# Patient Record
Sex: Female | Born: 1970 | Race: White | Hispanic: No | Smoking: Former smoker
Health system: Southern US, Community
[De-identification: ages and names within clinical notes are randomized; demographics above are authoritative.]

## PROBLEM LIST (undated history)

## (undated) DIAGNOSIS — T7840XA Allergy, unspecified, initial encounter: Secondary | ICD-10-CM

## (undated) DIAGNOSIS — F419 Anxiety disorder, unspecified: Secondary | ICD-10-CM

## (undated) DIAGNOSIS — M1990 Unspecified osteoarthritis, unspecified site: Secondary | ICD-10-CM

## (undated) DIAGNOSIS — J45909 Unspecified asthma, uncomplicated: Secondary | ICD-10-CM

## (undated) DIAGNOSIS — F909 Attention-deficit hyperactivity disorder, unspecified type: Secondary | ICD-10-CM

## (undated) DIAGNOSIS — K219 Gastro-esophageal reflux disease without esophagitis: Secondary | ICD-10-CM

## (undated) HISTORY — PX: SCALP REDUCTION: SHX2377

## (undated) HISTORY — DX: Unspecified osteoarthritis, unspecified site: M19.90

## (undated) HISTORY — PX: COSMETIC SURGERY: SHX468

## (undated) HISTORY — DX: Anxiety disorder, unspecified: F41.9

## (undated) HISTORY — PX: TUBAL LIGATION: SHX77

## (undated) HISTORY — DX: Attention-deficit hyperactivity disorder, unspecified type: F90.9

## (undated) HISTORY — DX: Allergy, unspecified, initial encounter: T78.40XA

## (undated) HISTORY — DX: Gastro-esophageal reflux disease without esophagitis: K21.9

---

## 1999-04-30 ENCOUNTER — Other Ambulatory Visit: Admission: RE | Admit: 1999-04-30 | Discharge: 1999-04-30 | Payer: Self-pay | Admitting: Obstetrics & Gynecology

## 1999-11-27 ENCOUNTER — Inpatient Hospital Stay (HOSPITAL_COMMUNITY): Admission: AD | Admit: 1999-11-27 | Discharge: 1999-11-29 | Payer: Self-pay | Admitting: Obstetrics & Gynecology

## 1999-12-28 ENCOUNTER — Other Ambulatory Visit: Admission: RE | Admit: 1999-12-28 | Discharge: 1999-12-28 | Payer: Self-pay | Admitting: Obstetrics & Gynecology

## 2003-03-16 ENCOUNTER — Emergency Department (HOSPITAL_COMMUNITY): Admission: EM | Admit: 2003-03-16 | Discharge: 2003-03-16 | Payer: Self-pay | Admitting: Emergency Medicine

## 2004-12-09 ENCOUNTER — Other Ambulatory Visit: Admission: RE | Admit: 2004-12-09 | Discharge: 2004-12-09 | Payer: Self-pay | Admitting: Obstetrics and Gynecology

## 2005-10-22 ENCOUNTER — Other Ambulatory Visit: Admission: RE | Admit: 2005-10-22 | Discharge: 2005-10-22 | Payer: Self-pay | Admitting: Obstetrics and Gynecology

## 2006-04-25 ENCOUNTER — Inpatient Hospital Stay (HOSPITAL_COMMUNITY): Admission: AD | Admit: 2006-04-25 | Discharge: 2006-04-27 | Payer: Self-pay | Admitting: Obstetrics and Gynecology

## 2007-06-08 ENCOUNTER — Encounter: Admission: RE | Admit: 2007-06-08 | Discharge: 2007-06-08 | Payer: Self-pay | Admitting: Emergency Medicine

## 2007-06-17 ENCOUNTER — Encounter: Admission: RE | Admit: 2007-06-17 | Discharge: 2007-06-17 | Payer: Self-pay | Admitting: Emergency Medicine

## 2007-06-19 ENCOUNTER — Encounter: Admission: RE | Admit: 2007-06-19 | Discharge: 2007-06-19 | Payer: Self-pay | Admitting: Emergency Medicine

## 2007-10-31 ENCOUNTER — Emergency Department (HOSPITAL_COMMUNITY): Admission: EM | Admit: 2007-10-31 | Discharge: 2007-10-31 | Payer: Self-pay | Admitting: Emergency Medicine

## 2008-06-25 ENCOUNTER — Inpatient Hospital Stay (HOSPITAL_COMMUNITY): Admission: AD | Admit: 2008-06-25 | Discharge: 2008-06-25 | Payer: Self-pay | Admitting: Obstetrics and Gynecology

## 2008-12-16 DIAGNOSIS — R55 Syncope and collapse: Secondary | ICD-10-CM

## 2008-12-16 DIAGNOSIS — D649 Anemia, unspecified: Secondary | ICD-10-CM | POA: Insufficient documentation

## 2008-12-16 DIAGNOSIS — F329 Major depressive disorder, single episode, unspecified: Secondary | ICD-10-CM

## 2008-12-16 DIAGNOSIS — F429 Obsessive-compulsive disorder, unspecified: Secondary | ICD-10-CM | POA: Insufficient documentation

## 2008-12-16 DIAGNOSIS — G510 Bell's palsy: Secondary | ICD-10-CM

## 2008-12-16 DIAGNOSIS — R87619 Unspecified abnormal cytological findings in specimens from cervix uteri: Secondary | ICD-10-CM

## 2009-01-14 ENCOUNTER — Inpatient Hospital Stay (HOSPITAL_COMMUNITY): Admission: AD | Admit: 2009-01-14 | Discharge: 2009-01-14 | Payer: Self-pay | Admitting: Obstetrics and Gynecology

## 2009-01-26 ENCOUNTER — Inpatient Hospital Stay (HOSPITAL_COMMUNITY)
Admission: AD | Admit: 2009-01-26 | Discharge: 2009-01-28 | Payer: Self-pay | Source: Ambulatory Visit | Admitting: Obstetrics and Gynecology

## 2009-01-26 ENCOUNTER — Encounter (INDEPENDENT_AMBULATORY_CARE_PROVIDER_SITE_OTHER): Payer: Self-pay | Admitting: Obstetrics and Gynecology

## 2010-03-15 ENCOUNTER — Encounter: Payer: Self-pay | Admitting: Emergency Medicine

## 2010-05-26 LAB — CBC
HCT: 30.5 % — ABNORMAL LOW (ref 36.0–46.0)
HCT: 34.8 % — ABNORMAL LOW (ref 36.0–46.0)
Hemoglobin: 11.4 g/dL — ABNORMAL LOW (ref 12.0–15.0)
MCHC: 32.8 g/dL (ref 30.0–36.0)
MCHC: 32.8 g/dL (ref 30.0–36.0)
MCV: 83.6 fL (ref 78.0–100.0)
RBC: 3.64 MIL/uL — ABNORMAL LOW (ref 3.87–5.11)
RBC: 4.23 MIL/uL (ref 3.87–5.11)
RDW: 17.2 % — ABNORMAL HIGH (ref 11.5–15.5)

## 2010-06-02 LAB — CBC
HCT: 41.6 % (ref 36.0–46.0)
MCV: 84.9 fL (ref 78.0–100.0)
Platelets: 297 10*3/uL (ref 150–400)
RDW: 16.6 % — ABNORMAL HIGH (ref 11.5–15.5)
WBC: 11.2 10*3/uL — ABNORMAL HIGH (ref 4.0–10.5)

## 2010-06-02 LAB — URINALYSIS, ROUTINE W REFLEX MICROSCOPIC
Glucose, UA: >1000 mg/dL — AB
Ketones, ur: NEGATIVE mg/dL
Leukocytes, UA: NEGATIVE
Nitrite: NEGATIVE
Specific Gravity, Urine: <1.005 — ABNORMAL LOW (ref 1.005–1.030)
pH: 6.5 (ref 5.0–8.0)

## 2010-06-02 LAB — COMPREHENSIVE METABOLIC PANEL
AST: 19 U/L (ref 0–37)
Albumin: 4.1 g/dL (ref 3.5–5.2)
BUN: 7 mg/dL (ref 6–23)
Calcium: 10.2 mg/dL (ref 8.4–10.5)
Chloride: 102 mEq/L (ref 96–112)
Creatinine, Ser: 0.57 mg/dL (ref 0.4–1.2)
GFR calc Af Amer: >60 mL/min (ref >60)
Total Protein: 7.3 g/dL (ref 6.0–8.3)

## 2010-06-02 LAB — LIPASE, BLOOD: Lipase: 13 U/L (ref 11–59)

## 2010-06-02 LAB — URINE MICROSCOPIC-ADD ON
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
## 2014-08-06 ENCOUNTER — Telehealth: Payer: Self-pay | Admitting: Family Medicine

## 2014-08-06 NOTE — Telephone Encounter (Signed)
Left message asking pt to call office Please schedule new patient appointment

## 2014-08-07 NOTE — Telephone Encounter (Signed)
Pt retuned your call. Does it need to be a new pt appt or can any 30 min appt work?

## 2014-08-08 NOTE — Telephone Encounter (Signed)
Pt wanted to make a new patient appointment with you.  Your next new patient is 02/25/15.  Pt wanted to know if she could be worked in sooner.  Is ok to schedule? thanks

## 2014-08-08 NOTE — Telephone Encounter (Signed)
30 min appointment when possible, not on a Monday or Friday.  Thanks.

## 2014-08-13 NOTE — Telephone Encounter (Signed)
Left message asking pt to call office Please see note below and schedule appointment

## 2014-08-14 NOTE — Telephone Encounter (Signed)
Appointment 7/28 °Pt aware °

## 2014-09-19 ENCOUNTER — Ambulatory Visit: Payer: Self-pay | Admitting: Family Medicine

## 2018-07-25 ENCOUNTER — Telehealth: Payer: Self-pay

## 2018-07-25 NOTE — Telephone Encounter (Signed)
Error

## 2021-05-27 ENCOUNTER — Other Ambulatory Visit: Payer: Self-pay

## 2021-05-27 ENCOUNTER — Emergency Department (HOSPITAL_BASED_OUTPATIENT_CLINIC_OR_DEPARTMENT_OTHER)
Admission: EM | Admit: 2021-05-27 | Discharge: 2021-05-28 | Disposition: A | Discharge status: Home / Self Care | Payer: Medicaid Other | Attending: Emergency Medicine | Admitting: Emergency Medicine

## 2021-05-27 ENCOUNTER — Encounter (HOSPITAL_BASED_OUTPATIENT_CLINIC_OR_DEPARTMENT_OTHER): Payer: Self-pay | Admitting: Emergency Medicine

## 2021-05-27 ENCOUNTER — Emergency Department (HOSPITAL_BASED_OUTPATIENT_CLINIC_OR_DEPARTMENT_OTHER): Payer: Medicaid Other | Admitting: Radiology

## 2021-05-27 ENCOUNTER — Emergency Department (HOSPITAL_BASED_OUTPATIENT_CLINIC_OR_DEPARTMENT_OTHER): Payer: Medicaid Other

## 2021-05-27 DIAGNOSIS — R410 Disorientation, unspecified: Secondary | ICD-10-CM | POA: Diagnosis not present

## 2021-05-27 DIAGNOSIS — S8011XA Contusion of right lower leg, initial encounter: Secondary | ICD-10-CM | POA: Diagnosis not present

## 2021-05-27 DIAGNOSIS — S80212A Abrasion, left knee, initial encounter: Secondary | ICD-10-CM | POA: Diagnosis not present

## 2021-05-27 DIAGNOSIS — S5012XA Contusion of left forearm, initial encounter: Secondary | ICD-10-CM | POA: Insufficient documentation

## 2021-05-27 DIAGNOSIS — S0993XA Unspecified injury of face, initial encounter: Secondary | ICD-10-CM | POA: Diagnosis present

## 2021-05-27 DIAGNOSIS — S01451A Open bite of right cheek and temporomandibular area, initial encounter: Secondary | ICD-10-CM | POA: Insufficient documentation

## 2021-05-27 DIAGNOSIS — S5011XA Contusion of right forearm, initial encounter: Secondary | ICD-10-CM | POA: Diagnosis not present

## 2021-05-27 DIAGNOSIS — Z23 Encounter for immunization: Secondary | ICD-10-CM | POA: Diagnosis not present

## 2021-05-27 DIAGNOSIS — S1083XA Contusion of other specified part of neck, initial encounter: Secondary | ICD-10-CM | POA: Insufficient documentation

## 2021-05-27 DIAGNOSIS — S40022A Contusion of left upper arm, initial encounter: Secondary | ICD-10-CM | POA: Diagnosis not present

## 2021-05-27 DIAGNOSIS — M542 Cervicalgia: Secondary | ICD-10-CM | POA: Diagnosis not present

## 2021-05-27 DIAGNOSIS — W503XXA Accidental bite by another person, initial encounter: Secondary | ICD-10-CM

## 2021-05-27 DIAGNOSIS — S40021A Contusion of right upper arm, initial encounter: Secondary | ICD-10-CM | POA: Diagnosis not present

## 2021-05-27 HISTORY — DX: Unspecified asthma, uncomplicated: J45.909

## 2021-05-27 LAB — CBC WITH DIFFERENTIAL/PLATELET
Abs Immature Granulocytes: 0.03 10*3/uL (ref 0.00–0.07)
Basophils Absolute: 0.1 10*3/uL (ref 0.0–0.1)
Basophils Relative: 0 %
Eosinophils Absolute: 0.1 10*3/uL (ref 0.0–0.5)
Eosinophils Relative: 1 %
HCT: 42.7 % (ref 36.0–46.0)
Hemoglobin: 14 g/dL (ref 12.0–15.0)
Immature Granulocytes: 0 %
Lymphocytes Relative: 26 %
Lymphs Abs: 3 10*3/uL (ref 0.7–4.0)
MCH: 29.2 pg (ref 26.0–34.0)
MCHC: 32.8 g/dL (ref 30.0–36.0)
MCV: 89 fL (ref 80.0–100.0)
Monocytes Absolute: 0.9 10*3/uL (ref 0.1–1.0)
Monocytes Relative: 8 %
Neutro Abs: 7.7 10*3/uL (ref 1.7–7.7)
Neutrophils Relative %: 65 %
Platelets: 340 10*3/uL (ref 150–400)
RBC: 4.8 MIL/uL (ref 3.87–5.11)
RDW: 12.7 % (ref 11.5–15.5)
WBC: 11.7 10*3/uL — ABNORMAL HIGH (ref 4.0–10.5)
nRBC: 0 % (ref 0.0–0.2)

## 2021-05-27 LAB — BASIC METABOLIC PANEL
Anion gap: 11 (ref 5–15)
BUN: 17 mg/dL (ref 6–20)
CO2: 24 mmol/L (ref 22–32)
Calcium: 10.3 mg/dL (ref 8.9–10.3)
Chloride: 106 mmol/L (ref 98–111)
Creatinine, Ser: 0.84 mg/dL (ref 0.44–1.00)
GFR, Estimated: >60 mL/min (ref >60)
Glucose, Bld: 122 mg/dL — ABNORMAL HIGH (ref 70–99)
Potassium: 3.4 mmol/L — ABNORMAL LOW (ref 3.5–5.1)
Sodium: 141 mmol/L (ref 135–145)

## 2021-05-27 LAB — PREGNANCY, URINE: Preg Test, Ur: NEGATIVE

## 2021-05-27 MED ORDER — METRONIDAZOLE 500 MG PO TABS: 500.0000 mg | ORAL_TABLET | Freq: Two times a day (BID) | ORAL | 0 refills | Status: DC

## 2021-05-27 MED ORDER — DOXYCYCLINE HYCLATE 100 MG PO CAPS: 100.0000 mg | ORAL_CAPSULE | Freq: Two times a day (BID) | ORAL | 0 refills | Status: AC

## 2021-05-27 MED ORDER — ACETAMINOPHEN 325 MG PO TABS
650.0000 mg | ORAL_TABLET | Freq: Once | ORAL | Status: AC
Start: 2021-05-27 — End: 2021-05-27
  Administered 2021-05-27: 650 mg via ORAL
  Filled 2021-05-27: qty 2

## 2021-05-27 MED ORDER — IOHEXOL 350 MG/ML SOLN
100.0000 mL | Freq: Once | INTRAVENOUS | Status: AC | PRN
  Administered 2021-05-27: 75 mL via INTRAVENOUS

## 2021-05-27 MED ORDER — TETANUS-DIPHTH-ACELL PERTUSSIS 5-2.5-18.5 LF-MCG/0.5 IM SUSY
0.5000 mL | PREFILLED_SYRINGE | Freq: Once | INTRAMUSCULAR | Status: AC
  Administered 2021-05-28: 0.5 mL via INTRAMUSCULAR
  Filled 2021-05-27: qty 0.5
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
## 2021-05-28 ENCOUNTER — Telehealth: Payer: Self-pay | Admitting: Family Medicine
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
## 2021-05-28 NOTE — Telephone Encounter (Signed)
Opened in error.  I am not the PCP and have not seen this patient.  ?

## 2021-06-30 ENCOUNTER — Encounter: Payer: Self-pay | Admitting: Nurse Practitioner

## 2021-06-30 ENCOUNTER — Ambulatory Visit: Payer: Medicaid Other | Admitting: Nurse Practitioner

## 2021-06-30 VITALS — BP 128/66 | HR 66 | Temp 97.5°F | Resp 14 | Ht 65.0 in | Wt 205.2 lb

## 2021-06-30 DIAGNOSIS — Z7689 Persons encountering health services in other specified circumstances: Secondary | ICD-10-CM

## 2021-06-30 DIAGNOSIS — F422 Mixed obsessional thoughts and acts: Secondary | ICD-10-CM | POA: Diagnosis not present

## 2021-06-30 DIAGNOSIS — D72829 Elevated white blood cell count, unspecified: Secondary | ICD-10-CM | POA: Diagnosis not present

## 2021-06-30 DIAGNOSIS — E6609 Other obesity due to excess calories: Secondary | ICD-10-CM

## 2021-06-30 DIAGNOSIS — Z6834 Body mass index (BMI) 34.0-34.9, adult: Secondary | ICD-10-CM | POA: Diagnosis not present

## 2021-06-30 DIAGNOSIS — M6289 Other specified disorders of muscle: Secondary | ICD-10-CM | POA: Diagnosis not present

## 2021-06-30 DIAGNOSIS — E876 Hypokalemia: Secondary | ICD-10-CM

## 2021-06-30 DIAGNOSIS — E669 Obesity, unspecified: Secondary | ICD-10-CM | POA: Insufficient documentation

## 2021-06-30 DIAGNOSIS — G510 Bell's palsy: Secondary | ICD-10-CM | POA: Diagnosis not present

## 2021-06-30 DIAGNOSIS — Z0000 Encounter for general adult medical examination without abnormal findings: Secondary | ICD-10-CM | POA: Insufficient documentation

## 2021-06-30 DIAGNOSIS — F411 Generalized anxiety disorder: Secondary | ICD-10-CM | POA: Diagnosis not present

## 2021-06-30 DIAGNOSIS — Z6836 Body mass index (BMI) 36.0-36.9, adult: Secondary | ICD-10-CM | POA: Insufficient documentation

## 2021-06-30 LAB — CBC
HCT: 40.4 % (ref 36.0–46.0)
Hemoglobin: 13.5 g/dL (ref 12.0–15.0)
MCHC: 33.3 g/dL (ref 30.0–36.0)
MCV: 89.3 fl (ref 78.0–100.0)
Platelets: 273 10*3/uL (ref 150.0–400.0)
RBC: 4.53 Mil/uL (ref 3.87–5.11)
RDW: 12.9 % (ref 11.5–15.5)
WBC: 7.5 10*3/uL (ref 4.0–10.5)

## 2021-06-30 LAB — COMPREHENSIVE METABOLIC PANEL
ALT: 25 U/L (ref 0–35)
AST: 14 U/L (ref 0–37)
Albumin: 4.3 g/dL (ref 3.5–5.2)
Alkaline Phosphatase: 65 U/L (ref 39–117)
BUN: 14 mg/dL (ref 6–23)
CO2: 28 mEq/L (ref 19–32)
Calcium: 9.6 mg/dL (ref 8.4–10.5)
Chloride: 104 mEq/L (ref 96–112)
Creatinine, Ser: 0.88 mg/dL (ref 0.40–1.20)
GFR: 77.69 mL/min (ref >60.00)
Glucose, Bld: 88 mg/dL (ref 70–99)
Potassium: 4.1 mEq/L (ref 3.5–5.1)
Sodium: 139 mEq/L (ref 135–145)
Total Bilirubin: 0.4 mg/dL (ref 0.2–1.2)
Total Protein: 7.2 g/dL (ref 6.0–8.3)

## 2021-06-30 LAB — TSH: TSH: 0.86 u[IU]/mL (ref 0.35–5.50)

## 2021-06-30 LAB — LIPID PANEL
Cholesterol: 230 mg/dL — ABNORMAL HIGH (ref 0–200)
HDL: 47.8 mg/dL (ref >39.00)
LDL Cholesterol: 164 mg/dL — ABNORMAL HIGH (ref 0–99)
NonHDL: 181.73
Total CHOL/HDL Ratio: 5
Triglycerides: 87 mg/dL (ref 0.0–149.0)
VLDL: 17.4 mg/dL (ref 0.0–40.0)

## 2021-06-30 LAB — HEMOGLOBIN A1C: Hgb A1c MFr Bld: 6.4 % (ref 4.6–6.5)

## 2021-06-30 MED ORDER — CITALOPRAM HYDROBROMIDE 10 MG PO TABS: 10.0000 mg | ORAL_TABLET | Freq: Every day | ORAL | 3 refills | Status: DC

## 2021-06-30 MED ORDER — HYDROXYZINE PAMOATE 25 MG PO CAPS: 25.0000 mg | ORAL_CAPSULE | Freq: Two times a day (BID) | ORAL | 0 refills | Status: DC | PRN

## 2021-06-30 MED ORDER — CYCLOBENZAPRINE HCL 5 MG PO TABS: 5.0000 mg | ORAL_TABLET | Freq: Two times a day (BID) | ORAL | 0 refills | Status: DC | PRN

## 2021-06-30 NOTE — Assessment & Plan Note (Signed)
Most recent emergency room visit note, labs, imaging reviewed ?

## 2021-06-30 NOTE — Assessment & Plan Note (Signed)
Incidental finding from previous labs in the emergency department.  Pending labs today. ?

## 2021-06-30 NOTE — Assessment & Plan Note (Signed)
Pending labs today patient has lost approximately 55 pounds on her own with dietary modifications continue good work ?

## 2021-06-30 NOTE — Assessment & Plan Note (Signed)
History of the same.  PHQ-9 and GAD-7 administered in office.  Patient denies HI/SI/AVH.  Patient on citalopram 10 mg nightly and hydroxyzine 25 mg twice daily as needed anxiety.  Did review common side effects of both medications.  We will have patient follow-up emergency behavioral health information given in case of behavioral health emergency ?

## 2021-06-30 NOTE — Assessment & Plan Note (Signed)
We will try Flexeril 5 mg 3 times daily as needed muscle tightness sedation precautions reviewed with patient ?
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
## 2021-06-30 NOTE — Assessment & Plan Note (Signed)
Incidental finding on emergency room labs pending CBC today ?
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
## 2021-06-30 NOTE — Assessment & Plan Note (Signed)
Cervical diagnosis affecting left side of face. ?

## 2021-06-30 NOTE — Patient Instructions (Signed)
Nice to see you today ?I will be in touch with the lab results ?Follow up with me in 6 weeks, sooner if you need me ? ?The flexeril may make you sleepy (muscle relaxer) ?

## 2021-07-03 ENCOUNTER — Other Ambulatory Visit: Payer: Self-pay | Admitting: Nurse Practitioner

## 2021-07-03 ENCOUNTER — Encounter: Payer: Self-pay | Admitting: Nurse Practitioner

## 2021-07-03 DIAGNOSIS — F411 Generalized anxiety disorder: Secondary | ICD-10-CM

## 2021-07-03 MED ORDER — ALPRAZOLAM 0.25 MG PO TABS: 0.2500 mg | ORAL_TABLET | Freq: Two times a day (BID) | ORAL | 0 refills | Status: DC | PRN

## 2021-07-03 MED ORDER — OMEPRAZOLE 20 MG PO CPDR: 20.0000 mg | DELAYED_RELEASE_CAPSULE | Freq: Every day | ORAL | 0 refills | Status: DC

## 2021-07-06 ENCOUNTER — Other Ambulatory Visit: Payer: Self-pay | Admitting: Nurse Practitioner

## 2021-07-06 DIAGNOSIS — F411 Generalized anxiety disorder: Secondary | ICD-10-CM

## 2021-07-06 MED ORDER — LORAZEPAM 0.5 MG PO TABS
0.5000 mg | ORAL_TABLET | Freq: Two times a day (BID) | ORAL | 0 refills | Status: DC | PRN
Start: 2021-07-06 — End: 2021-07-23

## 2021-07-16 ENCOUNTER — Other Ambulatory Visit: Payer: Self-pay | Admitting: Nurse Practitioner

## 2021-07-16 DIAGNOSIS — F411 Generalized anxiety disorder: Secondary | ICD-10-CM

## 2021-07-17 NOTE — Telephone Encounter (Signed)
F/u made for 08/14/21. Patient states she does need a refill. She has 4 tablets left

## 2021-07-22 ENCOUNTER — Other Ambulatory Visit: Payer: Self-pay | Admitting: Nurse Practitioner

## 2021-07-22 ENCOUNTER — Encounter: Payer: Self-pay | Admitting: Nurse Practitioner

## 2021-07-22 DIAGNOSIS — F411 Generalized anxiety disorder: Secondary | ICD-10-CM

## 2021-07-23 MED ORDER — LORAZEPAM 0.5 MG PO TABS: 0.5000 mg | ORAL_TABLET | Freq: Two times a day (BID) | ORAL | 0 refills | Status: DC | PRN

## 2021-07-30 ENCOUNTER — Other Ambulatory Visit: Payer: Self-pay | Admitting: Nurse Practitioner

## 2021-07-30 DIAGNOSIS — M6289 Other specified disorders of muscle: Secondary | ICD-10-CM

## 2021-08-14 ENCOUNTER — Ambulatory Visit: Payer: Medicaid Other | Admitting: Nurse Practitioner

## 2021-08-14 ENCOUNTER — Encounter: Payer: Self-pay | Admitting: Nurse Practitioner

## 2021-08-14 VITALS — BP 120/74 | HR 64 | Temp 96.2°F | Resp 16 | Ht 65.0 in | Wt 210.1 lb

## 2021-08-14 DIAGNOSIS — R42 Dizziness and giddiness: Secondary | ICD-10-CM

## 2021-08-14 DIAGNOSIS — R202 Paresthesia of skin: Secondary | ICD-10-CM | POA: Diagnosis not present

## 2021-08-14 DIAGNOSIS — F411 Generalized anxiety disorder: Secondary | ICD-10-CM | POA: Diagnosis not present

## 2021-08-23 ENCOUNTER — Other Ambulatory Visit: Payer: Self-pay | Admitting: Nurse Practitioner

## 2021-08-23 DIAGNOSIS — F411 Generalized anxiety disorder: Secondary | ICD-10-CM

## 2021-08-28 ENCOUNTER — Other Ambulatory Visit: Payer: Self-pay | Admitting: Nurse Practitioner

## 2021-08-28 ENCOUNTER — Other Ambulatory Visit: Payer: Self-pay | Admitting: Family

## 2021-08-28 ENCOUNTER — Encounter: Payer: Self-pay | Admitting: Nurse Practitioner

## 2021-08-28 DIAGNOSIS — M6289 Other specified disorders of muscle: Secondary | ICD-10-CM

## 2021-08-28 DIAGNOSIS — F411 Generalized anxiety disorder: Secondary | ICD-10-CM

## 2021-08-28 MED ORDER — LORAZEPAM 0.5 MG PO TABS: 0.5000 mg | ORAL_TABLET | Freq: Two times a day (BID) | ORAL | 0 refills | Status: DC | PRN

## 2021-08-28 MED ORDER — CYCLOBENZAPRINE HCL 5 MG PO TABS: ORAL_TABLET | ORAL | 0 refills | Status: DC

## 2021-09-07 ENCOUNTER — Other Ambulatory Visit: Payer: Self-pay | Admitting: Nurse Practitioner

## 2021-09-07 DIAGNOSIS — F411 Generalized anxiety disorder: Secondary | ICD-10-CM

## 2021-09-23 ENCOUNTER — Other Ambulatory Visit: Payer: Self-pay | Admitting: Nurse Practitioner

## 2021-09-23 DIAGNOSIS — F411 Generalized anxiety disorder: Secondary | ICD-10-CM

## 2021-09-23 DIAGNOSIS — M6289 Other specified disorders of muscle: Secondary | ICD-10-CM

## 2021-10-12 ENCOUNTER — Other Ambulatory Visit: Payer: Self-pay | Admitting: Nurse Practitioner

## 2021-10-12 ENCOUNTER — Ambulatory Visit: Payer: Medicaid Other | Admitting: Family Medicine

## 2021-10-12 ENCOUNTER — Encounter: Payer: Self-pay | Admitting: Family Medicine

## 2021-10-12 VITALS — BP 110/70 | HR 84 | Temp 98.3°F | Resp 16 | Ht 65.0 in | Wt 205.5 lb

## 2021-10-12 DIAGNOSIS — L237 Allergic contact dermatitis due to plants, except food: Secondary | ICD-10-CM

## 2021-10-12 MED ORDER — TRIAMCINOLONE ACETONIDE 0.1 % EX CREA: 1.0000 | TOPICAL_CREAM | Freq: Two times a day (BID) | CUTANEOUS | 0 refills | Status: AC
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
## 2021-10-12 NOTE — Progress Notes (Signed)
   Subjective:     [name] is a 51 y.o. female presenting for Poison Ivy (X 4-5 day side, stomach, and arm)     HPI  #Poison ivy - was working in the yard and moving brush - last week - treatment: hydrocortisone, calamine, ice, alcohol, benadryl - trunk - has spread to the breast and lower abdomen and arm - itching - hydroxyzine and benadryl -    Review of Systems   Social History   Tobacco Use  Smoking Status Former   Packs/day: 0.25   Years: 30.00   Total pack years: 7.50   Types: Cigarettes   Quit date: 2016   Years since quitting: 7.6  Smokeless Tobacco Never        Objective:    BP Readings from Last 3 Encounters:  10/12/21 110/70  08/14/21 120/74  06/30/21 128/66   Wt Readings from Last 3 Encounters:  10/12/21 205 lb 8 oz (93.2 kg)  08/14/21 210 lb 2 oz (95.3 kg)  06/30/21 205 lb 4 oz (93.1 kg)    BP 110/70   Pulse 84   Temp 98.3 F (36.8 C)   Resp 16   Ht 5\' 5"  (1.651 m)   Wt 205 lb 8 oz (93.2 kg)   LMP 06/06/2021 (Exact Date)   SpO2 96%   BMI 34.20 kg/m    Physical Exam Constitutional:      General: She is not in acute distress.    Appearance: She is well-developed. She is not diaphoretic.  HENT:     Right Ear: External ear normal.     Left Ear: External ear normal.     Nose: Nose normal.  Eyes:     Conjunctiva/sclera: Conjunctivae normal.  Cardiovascular:     Rate and Rhythm: Normal rate.  Pulmonary:     Effort: Pulmonary effort is normal.  Musculoskeletal:     Cervical back: Neck supple.  Skin:    General: Skin is warm and dry.     Capillary Refill: Capillary refill takes less than 2 seconds.     Comments: Erythematous plaque along the abdomen and right inner arm. Some liner collections  Neurological:     Mental Status: She is alert. Mental status is at baseline.  Psychiatric:        Mood and Affect: Mood normal.        Behavior: Behavior normal.           Assessment & Plan:   Problem List Items  Addressed This Visit   None Visit Diagnoses     Poison ivy dermatitis    -  Primary   Relevant Medications   triamcinolone cream (KENALOG) 0.1 %      Discussed with small portion of body and no face involvement at this time would not recommend oral steroids.  Follow-up if worsening.  Continue hydroxyzine, Benadryl, start 24-hour allergy pill.  Prescription for triamcinolone provided to use twice daily.  Return if symptoms worsen or fail to improve.  06/08/2021, MD

## 2021-10-14 ENCOUNTER — Encounter: Payer: Self-pay | Admitting: Family Medicine

## 2021-10-14 DIAGNOSIS — L237 Allergic contact dermatitis due to plants, except food: Secondary | ICD-10-CM

## 2021-10-14 MED ORDER — PREDNISONE 20 MG PO TABS: ORAL_TABLET | ORAL | 0 refills | Status: AC

## 2021-11-19 ENCOUNTER — Other Ambulatory Visit: Payer: Self-pay | Admitting: Nurse Practitioner

## 2021-11-19 DIAGNOSIS — M6289 Other specified disorders of muscle: Secondary | ICD-10-CM

## 2021-11-19 DIAGNOSIS — F411 Generalized anxiety disorder: Secondary | ICD-10-CM

## 2022-01-07 ENCOUNTER — Other Ambulatory Visit: Payer: Self-pay | Admitting: Nurse Practitioner

## 2022-01-07 DIAGNOSIS — M6289 Other specified disorders of muscle: Secondary | ICD-10-CM

## 2022-01-07 DIAGNOSIS — F411 Generalized anxiety disorder: Secondary | ICD-10-CM

## 2022-01-07 NOTE — Telephone Encounter (Signed)
Has an appointment on 01/13/22

## 2022-01-12 DIAGNOSIS — H5213 Myopia, bilateral: Secondary | ICD-10-CM | POA: Diagnosis not present

## 2022-01-13 ENCOUNTER — Ambulatory Visit: Payer: Medicaid Other | Admitting: Nurse Practitioner

## 2022-01-13 VITALS — BP 120/88 | HR 88 | Temp 98.8°F | Resp 12 | Ht 65.0 in | Wt 220.0 lb

## 2022-01-13 DIAGNOSIS — K219 Gastro-esophageal reflux disease without esophagitis: Secondary | ICD-10-CM | POA: Diagnosis not present

## 2022-01-13 DIAGNOSIS — F411 Generalized anxiety disorder: Secondary | ICD-10-CM

## 2022-01-13 DIAGNOSIS — E7800 Pure hypercholesterolemia, unspecified: Secondary | ICD-10-CM

## 2022-01-13 DIAGNOSIS — M79672 Pain in left foot: Secondary | ICD-10-CM | POA: Insufficient documentation

## 2022-01-13 DIAGNOSIS — Z6836 Body mass index (BMI) 36.0-36.9, adult: Secondary | ICD-10-CM

## 2022-01-13 DIAGNOSIS — R7303 Prediabetes: Secondary | ICD-10-CM | POA: Diagnosis not present

## 2022-01-13 LAB — POCT GLYCOSYLATED HEMOGLOBIN (HGB A1C): Hemoglobin A1C: 6.4 % — AB (ref 4.0–5.6)

## 2022-01-13 LAB — LIPID PANEL
Cholesterol: 234 mg/dL — ABNORMAL HIGH (ref 0–200)
HDL: 46.6 mg/dL (ref >39.00)
LDL Cholesterol: 162 mg/dL — ABNORMAL HIGH (ref 0–99)
NonHDL: 187.36
Total CHOL/HDL Ratio: 5
Triglycerides: 129 mg/dL (ref 0.0–149.0)
VLDL: 25.8 mg/dL (ref 0.0–40.0)

## 2022-01-13 MED ORDER — FAMOTIDINE 20 MG PO TABS: 20.0000 mg | ORAL_TABLET | Freq: Every day | ORAL | 1 refills | Status: DC | PRN
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
## 2022-01-13 NOTE — Assessment & Plan Note (Signed)
2/2 with prediabetes and obesity.  Pending lipid panel today continue working on healthy lifestyle modifications

## 2022-01-13 NOTE — Assessment & Plan Note (Signed)
Patient has gained approximately 15 pounds since last Office visit.  She has mentioned that she is lost up to 60 pounds in the past.  Encourage patient to start getting back on healthy lifestyle modifications and attempt to lose weight.

## 2022-01-13 NOTE — Patient Instructions (Signed)
Nice to see you today I will be in touch with the lab results once I have them Follow up with me in 6 months, sooner if you need me Work on starting to loose some weight  Get an ankle support to help with the pain. Call and make an appointment with Dr. Karleen Hampshire Copland

## 2022-01-13 NOTE — Assessment & Plan Note (Signed)
Patient is been omeprazole 20 mg daily.  Patient states he is having some breakthrough symptoms of been taking second omeprazole some days.  Discouraged patient due to this we will use an H2 blocker instead she continue taking omeprazole 20 mg daily we will send in Pepcid 20 mg nightly as needed.
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
## 2022-01-13 NOTE — Assessment & Plan Note (Signed)
Ambiguous in nature.  No regular flags on exam.  Patient has tried over-the-counter analgesics.  Did encourage support and follow with her sports medicine physician.

## 2022-01-13 NOTE — Assessment & Plan Note (Signed)
A1c 6.4%.  Same as it was approximate 6 months ago.  Encouraged healthy lifestyle modifications

## 2022-01-18 ENCOUNTER — Encounter: Payer: Self-pay | Admitting: Nurse Practitioner

## 2022-01-18 DIAGNOSIS — R7303 Prediabetes: Secondary | ICD-10-CM

## 2022-01-18 MED ORDER — METFORMIN HCL 500 MG PO TABS: 500.0000 mg | ORAL_TABLET | Freq: Every day | ORAL | 0 refills | Status: DC

## 2022-01-18 NOTE — Telephone Encounter (Signed)
Please see message from patient. Lab appt made already

## 2022-01-31 ENCOUNTER — Other Ambulatory Visit: Payer: Self-pay | Admitting: Nurse Practitioner

## 2022-02-08 ENCOUNTER — Other Ambulatory Visit: Payer: Self-pay | Admitting: Nurse Practitioner

## 2022-02-08 DIAGNOSIS — K219 Gastro-esophageal reflux disease without esophagitis: Secondary | ICD-10-CM

## 2022-02-10 ENCOUNTER — Encounter: Payer: Self-pay | Admitting: Nurse Practitioner

## 2022-02-10 NOTE — Telephone Encounter (Signed)
Tried to call patient and got her voicemail. Left message for patient to call the office back. Audria Nine NP stated that he will work her in tomorrow 02/11/22 at 4:20 pm. Susy Frizzle stated that he can do a virtual visit or see her in person since she has tested positive for covid.

## 2022-02-10 NOTE — Telephone Encounter (Signed)
Staff will reach out and offer her a earlier appointment

## 2022-02-10 NOTE — Telephone Encounter (Signed)
Patient is okay with a virtual call with [facility] tomorrow 02/11/22.

## 2022-02-10 NOTE — Telephone Encounter (Signed)
Called and spoke to patient by telephone. Patient was advised  that unfortunately we do not have anything available until Friday. Patient was advised that I can help her get set up with [facility] for an E-visit. Patient declined stating that she will just keep the appointment Friday. Patient was offered for the appointment to be changed to a virtual since she has covid . Patient stated that she would rather just come in and see Matt in person.

## 2022-02-10 NOTE — Telephone Encounter (Signed)
Spoke to patient by telephone and moved her appointment up to 02/11/22 at 4:20. Patient was given ER precautions and she verbalized understanding.

## 2022-02-11 ENCOUNTER — Encounter: Payer: Self-pay | Admitting: Nurse Practitioner

## 2022-02-11 ENCOUNTER — Telehealth (INDEPENDENT_AMBULATORY_CARE_PROVIDER_SITE_OTHER): Payer: Medicaid Other | Admitting: Nurse Practitioner

## 2022-02-11 VITALS — Temp 102.2°F | Ht 65.0 in | Wt 218.0 lb

## 2022-02-11 DIAGNOSIS — M6289 Other specified disorders of muscle: Secondary | ICD-10-CM | POA: Diagnosis not present

## 2022-02-11 DIAGNOSIS — F411 Generalized anxiety disorder: Secondary | ICD-10-CM | POA: Diagnosis not present

## 2022-02-11 DIAGNOSIS — R051 Acute cough: Secondary | ICD-10-CM | POA: Diagnosis not present

## 2022-02-11 DIAGNOSIS — U071 COVID-19: Secondary | ICD-10-CM

## 2022-02-11 DIAGNOSIS — R0602 Shortness of breath: Secondary | ICD-10-CM | POA: Diagnosis not present

## 2022-02-11 MED ORDER — ALBUTEROL SULFATE HFA 108 (90 BASE) MCG/ACT IN AERS: 2.0000 | INHALATION_SPRAY | Freq: Four times a day (QID) | RESPIRATORY_TRACT | 0 refills | Status: AC | PRN

## 2022-02-11 MED ORDER — LORAZEPAM 0.5 MG PO TABS: ORAL_TABLET | ORAL | 0 refills | Status: DC

## 2022-02-11 MED ORDER — NIRMATRELVIR/RITONAVIR (PAXLOVID)TABLET: 3.0000 | ORAL_TABLET | Freq: Two times a day (BID) | ORAL | 0 refills | Status: AC

## 2022-02-11 MED ORDER — BENZONATATE 200 MG PO CAPS: 200.0000 mg | ORAL_CAPSULE | Freq: Three times a day (TID) | ORAL | 0 refills | Status: DC | PRN

## 2022-02-11 MED ORDER — CYCLOBENZAPRINE HCL 5 MG PO TABS: 5.0000 mg | ORAL_TABLET | Freq: Two times a day (BID) | ORAL | 0 refills | Status: DC | PRN

## 2022-02-11 NOTE — Assessment & Plan Note (Signed)
Tessalon Perles 200 mg 3 times daily as needed cough. 

## 2022-02-11 NOTE — Assessment & Plan Note (Signed)
Patient is experiencing subjective shortness of breath.  Refill albuterol inhaler

## 2022-02-11 NOTE — Progress Notes (Signed)
Patient ID: [name], female    DOB: 10-05-1970, 51 y.o.   MRN: [id number]  Virtual visit completed through caregilty, a video enabled telemedicine application. Due to national recommendations of social distancing due to COVID-19, a virtual visit is felt to be most appropriate for this patient at this time. Reviewed limitations, risks, security and privacy concerns of performing a virtual visit and the availability of in person appointments. I also reviewed that there may be a patient responsible charge related to this service. The patient agreed to proceed.   Patient location: home Provider location: [place] [facility], office Persons participating in this virtual visit: patient, provider   If any vitals were documented, they were collected by patient at home unless specified below.    Temp (!) 102.2 F (39 C)   Ht 5\' 5"  (1.651 m)   Wt 218 lb (98.9 kg)   LMP 05/23/2020   BMI 36.28 kg/m    CC: Covid 19 Subjective:   HPI: [name] is a 51 y.o. female presenting on 02/11/2022 for Covid Positive (Tested + covid), Cough, Emesis, Fever, and Nasal Congestion    Symptoms yesterday with fever and vomiting States that she did covid test yesterday that was positve Covid vaccine: not up today Dayquill, nyquill and ibuprofen. Has been helping  Has tried tessalon perals that helped   Relevant past medical, surgical, family and social history reviewed and updated as indicated. Interim medical history since our last visit reviewed. Allergies and medications reviewed and updated. Outpatient Medications Prior to Visit  Medication Sig Dispense Refill   citalopram (CELEXA) 10 MG tablet TAKE 1 TABLET BY MOUTH EVERY DAY 90 tablet 2   famotidine (PEPCID) 20 MG tablet TAKE 1 TABLET (20 MG TOTAL) BY MOUTH DAILY AS NEEDED FOR HEARTBURN OR INDIGESTION. 90 tablet 0   hydrOXYzine (VISTARIL) 25 MG capsule TAKE 1 CAPSULE (25 MG TOTAL) BY MOUTH TWICE A DAY AS NEEDED 180 capsule 1    metFORMIN (GLUCOPHAGE) 500 MG tablet Take 1 tablet (500 mg total) by mouth daily with breakfast. 90 tablet 0   omeprazole (PRILOSEC) 20 MG capsule TAKE 1 CAPSULE BY MOUTH EVERY DAY 90 capsule 1   triamcinolone cream (KENALOG) 0.1 % Apply 1 Application topically 2 (two) times daily. 30 g 0   albuterol (VENTOLIN HFA) 108 (90 Base) MCG/ACT inhaler Inhale into the lungs every 6 (six) hours as needed for wheezing or shortness of breath.     cyclobenzaprine (FLEXERIL) 5 MG tablet TAKE 1 TABLET BY MOUTH 2 TIMES DAILY AS NEEDED FOR MUSCLE SPASMS. 15 tablet 0   LORazepam (ATIVAN) 0.5 MG tablet TAKE 1 TABLET BY MOUTH TWICE A DAY AS NEEDED FOR ANXIETY 20 tablet 0   No facility-administered medications prior to visit.     Per HPI unless specifically indicated in ROS section below Review of Systems  Constitutional:  Positive for appetite change, chills, fatigue and fever.       Drinking plenty of fluid  HENT:  Positive for sore throat. Negative for ear discharge and ear pain.   Respiratory:  Positive for cough and shortness of breath.   Gastrointestinal:  Positive for abdominal pain, nausea and vomiting.  Musculoskeletal:  Positive for myalgias.  Neurological:  Positive for headaches.   Objective:  Temp (!) 102.2 F (39 C)   Ht 5\' 5"  (1.651 m)   Wt 218 lb (98.9 kg)   LMP 05/23/2020   BMI 36.28 kg/m   Wt Readings from Last  3 Encounters:  02/11/22 218 lb (98.9 kg)  01/13/22 220 lb (99.8 kg)  10/12/21 205 lb 8 oz (93.2 kg)       Physical exam: Gen: alert, NAD, not ill appearing Pulm: speaks in complete sentences without increased work of breathing Psych: normal mood, normal thought content      Results for orders placed or performed in visit on 01/13/22  Lipid panel  Result Value Ref Range   Cholesterol 234 (H) 0 - 200 mg/dL   Triglycerides 836.6 0.0 - 149.0 mg/dL   HDL 29.47 >65.46 mg/dL   VLDL 50.3 0.0 - 54.6 mg/dL   LDL Cholesterol 568 (H) 0 - 99 mg/dL   Total CHOL/HDL Ratio 5     NonHDL 187.36   POCT glycosylated hemoglobin (Hb A1C)  Result Value Ref Range   Hemoglobin A1C 6.4 (A) 4.0 - 5.6 %   HbA1c POC (<> result, manual entry)     HbA1c, POC (prediabetic range)     HbA1c, POC (controlled diabetic range)     Assessment & Plan:   Problem List Items Addressed This Visit       Other   GAD (generalized anxiety disorder)   Relevant Medications   LORazepam (ATIVAN) 0.5 MG tablet   Muscle tightness   Relevant Medications   cyclobenzaprine (FLEXERIL) 5 MG tablet   Shortness of breath    Patient is experiencing subjective shortness of breath.  Refill albuterol inhaler      Relevant Medications   albuterol (VENTOLIN HFA) 108 (90 Base) MCG/ACT inhaler   Acute cough    Tessalon Perles 200 mg 3 times daily as needed cough.      COVID-19 - Primary    Tested positive for COVID-19 at home.  Did discuss antivirals are EUA only after discussion we will like to do Paxlovid.  Patient has not been vaccinated against COVID.  Did discuss common side effects of medication including change in taste and nausea.  Discussed CDC guidelines/recommendations in regards to self-isolation/quarantine.  Discussed signs and symptoms when to be seen urgent or emergently.  Follow-up if no improvement      Relevant Medications   nirmatrelvir/ritonavir (PAXLOVID) 20 x 150 MG & 10 x 100MG  TABS     Meds ordered this encounter  Medications   albuterol (VENTOLIN HFA) 108 (90 Base) MCG/ACT inhaler    Sig: Inhale 2 puffs into the lungs every 6 (six) hours as needed for wheezing or shortness of breath.    Dispense:  8 g    Refill:  0   cyclobenzaprine (FLEXERIL) 5 MG tablet    Sig: Take 1 tablet (5 mg total) by mouth 2 (two) times daily as needed for muscle spasms.    Dispense:  15 tablet    Refill:  0    Order Specific Question:   Supervising Provider    Answer:   A [[id number]]   nirmatrelvir/ritonavir (PAXLOVID) 20 x 150 MG & 10 x 100MG  TABS    Sig: Take 3 tablets by mouth 2  (two) times daily for 5 days. (Take nirmatrelvir 150 mg two tablets twice daily for 5 days and ritonavir 100 mg one tablet twice daily for 5 days) Patient GFR is 77    Dispense:  30 tablet    Refill:  0    Order Specific Question:   Supervising Provider    Answer:   Roxy Manns A [[id number]]   benzonatate (TESSALON) 200 MG capsule    Sig: Take 1 capsule (  200 mg total) by mouth 3 (three) times daily as needed for cough.    Dispense:  21 capsule    Refill:  0    Order Specific Question:   Supervising Provider    Answer:   TOWER, MARNE A [[id number]]   LORazepam (ATIVAN) 0.5 MG tablet    Sig: TAKE 1 TABLET BY MOUTH TWICE A DAY AS NEEDED FOR ANXIETY    Dispense:  20 tablet    Refill:  0    Not to exceed 4 additional fills before 05/19/2022    Order Specific Question:   Supervising Provider    Answer:   Roxy Manns A [[id number]]   No orders of the defined types were placed in this encounter.   I discussed the assessment and treatment plan with the patient. The patient was provided an opportunity to ask questions and all were answered. The patient agreed with the plan and demonstrated an understanding of the instructions. The patient was advised to call back or seek an in-person evaluation if the symptoms worsen or if the condition fails to improve as anticipated.  Follow up plan: No follow-ups on file.  Audria Nine, NP

## 2022-02-11 NOTE — Assessment & Plan Note (Signed)
Tested positive for COVID-19 at home.  Did discuss antivirals are EUA only after discussion we will like to do Paxlovid.  Patient has not been vaccinated against COVID.  Did discuss common side effects of medication including change in taste and nausea.  Discussed CDC guidelines/recommendations in regards to self-isolation/quarantine.  Discussed signs and symptoms when to be seen urgent or emergently.  Follow-up if no improvement

## 2022-02-12 ENCOUNTER — Ambulatory Visit: Payer: Medicaid Other | Admitting: Nurse Practitioner

## 2022-03-15 ENCOUNTER — Other Ambulatory Visit: Payer: Self-pay | Admitting: Nurse Practitioner

## 2022-03-15 DIAGNOSIS — R0602 Shortness of breath: Secondary | ICD-10-CM

## 2022-03-15 NOTE — Telephone Encounter (Signed)
Per patient she did not request this refill, thinks it was automatic from the pharmacy. She states she rarely uses the albuterol HFA and does not need a refill at this time.  Thanks!

## 2022-03-25 ENCOUNTER — Other Ambulatory Visit: Payer: Self-pay | Admitting: Nurse Practitioner

## 2022-03-25 DIAGNOSIS — F411 Generalized anxiety disorder: Secondary | ICD-10-CM

## 2022-03-25 DIAGNOSIS — M6289 Other specified disorders of muscle: Secondary | ICD-10-CM

## 2022-04-14 ENCOUNTER — Other Ambulatory Visit: Payer: Self-pay | Admitting: Nurse Practitioner

## 2022-04-14 DIAGNOSIS — R7303 Prediabetes: Secondary | ICD-10-CM

## 2022-04-14 DIAGNOSIS — F411 Generalized anxiety disorder: Secondary | ICD-10-CM

## 2022-04-22 ENCOUNTER — Other Ambulatory Visit (INDEPENDENT_AMBULATORY_CARE_PROVIDER_SITE_OTHER): Payer: Medicaid Other

## 2022-04-22 DIAGNOSIS — R7303 Prediabetes: Secondary | ICD-10-CM

## 2022-04-22 LAB — COMPREHENSIVE METABOLIC PANEL
ALT: 30 U/L (ref 0–35)
AST: 16 U/L (ref 0–37)
Albumin: 4.3 g/dL (ref 3.5–5.2)
Alkaline Phosphatase: 67 U/L (ref 39–117)
BUN: 18 mg/dL (ref 6–23)
CO2: 29 mEq/L (ref 19–32)
Calcium: 9.9 mg/dL (ref 8.4–10.5)
Chloride: 102 mEq/L (ref 96–112)
Creatinine, Ser: 0.95 mg/dL (ref 0.40–1.20)
GFR: 70.47 mL/min (ref >60.00)
Glucose, Bld: 83 mg/dL (ref 70–99)
Potassium: 4 mEq/L (ref 3.5–5.1)
Sodium: 139 mEq/L (ref 135–145)
Total Bilirubin: 0.3 mg/dL (ref 0.2–1.2)
Total Protein: 7.7 g/dL (ref 6.0–8.3)

## 2022-04-23 LAB — HEMOGLOBIN A1C: Hgb A1c MFr Bld: 6.3 % (ref 4.6–6.5)

## 2022-05-05 ENCOUNTER — Other Ambulatory Visit: Payer: Self-pay | Admitting: Nurse Practitioner

## 2022-05-05 DIAGNOSIS — K219 Gastro-esophageal reflux disease without esophagitis: Secondary | ICD-10-CM

## 2022-05-06 ENCOUNTER — Other Ambulatory Visit: Payer: Self-pay | Admitting: Nurse Practitioner

## 2022-05-06 DIAGNOSIS — M6289 Other specified disorders of muscle: Secondary | ICD-10-CM

## 2022-05-06 DIAGNOSIS — F411 Generalized anxiety disorder: Secondary | ICD-10-CM

## 2022-05-28 ENCOUNTER — Other Ambulatory Visit: Payer: Self-pay | Admitting: Nurse Practitioner

## 2022-05-28 DIAGNOSIS — F411 Generalized anxiety disorder: Secondary | ICD-10-CM

## 2022-06-11 ENCOUNTER — Other Ambulatory Visit: Payer: Self-pay | Admitting: Nurse Practitioner

## 2022-06-11 DIAGNOSIS — M6289 Other specified disorders of muscle: Secondary | ICD-10-CM

## 2022-06-11 DIAGNOSIS — F411 Generalized anxiety disorder: Secondary | ICD-10-CM

## 2022-06-17 ENCOUNTER — Other Ambulatory Visit: Payer: Self-pay | Admitting: Nurse Practitioner

## 2022-06-17 DIAGNOSIS — K219 Gastro-esophageal reflux disease without esophagitis: Secondary | ICD-10-CM

## 2022-07-15 ENCOUNTER — Ambulatory Visit (INDEPENDENT_AMBULATORY_CARE_PROVIDER_SITE_OTHER): Payer: Medicaid Other | Admitting: Nurse Practitioner

## 2022-07-15 ENCOUNTER — Encounter: Payer: Self-pay | Admitting: Nurse Practitioner

## 2022-07-15 ENCOUNTER — Other Ambulatory Visit (HOSPITAL_COMMUNITY)
Admission: RE | Admit: 2022-07-15 | Discharge: 2022-07-15 | Disposition: A | Discharge status: Home / Self Care | Payer: Medicaid Other | Source: Ambulatory Visit | Attending: Nurse Practitioner | Admitting: Nurse Practitioner

## 2022-07-15 VITALS — BP 130/78 | HR 63 | Temp 98.3°F | Resp 16 | Ht 64.5 in | Wt 202.0 lb

## 2022-07-15 DIAGNOSIS — E7800 Pure hypercholesterolemia, unspecified: Secondary | ICD-10-CM | POA: Diagnosis not present

## 2022-07-15 DIAGNOSIS — Z1231 Encounter for screening mammogram for malignant neoplasm of breast: Secondary | ICD-10-CM | POA: Diagnosis not present

## 2022-07-15 DIAGNOSIS — Z87891 Personal history of nicotine dependence: Secondary | ICD-10-CM

## 2022-07-15 DIAGNOSIS — K219 Gastro-esophageal reflux disease without esophagitis: Secondary | ICD-10-CM | POA: Diagnosis not present

## 2022-07-15 DIAGNOSIS — F411 Generalized anxiety disorder: Secondary | ICD-10-CM | POA: Diagnosis not present

## 2022-07-15 DIAGNOSIS — Z0000 Encounter for general adult medical examination without abnormal findings: Secondary | ICD-10-CM | POA: Diagnosis not present

## 2022-07-15 DIAGNOSIS — Z124 Encounter for screening for malignant neoplasm of cervix: Secondary | ICD-10-CM | POA: Diagnosis not present

## 2022-07-15 DIAGNOSIS — G510 Bell's palsy: Secondary | ICD-10-CM | POA: Diagnosis not present

## 2022-07-15 DIAGNOSIS — M6289 Other specified disorders of muscle: Secondary | ICD-10-CM | POA: Diagnosis not present

## 2022-07-15 DIAGNOSIS — Z1211 Encounter for screening for malignant neoplasm of colon: Secondary | ICD-10-CM

## 2022-07-15 DIAGNOSIS — R7303 Prediabetes: Secondary | ICD-10-CM

## 2022-07-15 LAB — CBC
HCT: 42.7 % (ref 36.0–46.0)
Hemoglobin: 13.8 g/dL (ref 12.0–15.0)
MCHC: 32.4 g/dL (ref 30.0–36.0)
MCV: 88.8 fl (ref 78.0–100.0)
Platelets: 275 10*3/uL (ref 150.0–400.0)
RBC: 4.81 Mil/uL (ref 3.87–5.11)
RDW: 13.7 % (ref 11.5–15.5)
WBC: 7.1 10*3/uL (ref 4.0–10.5)

## 2022-07-15 LAB — LIPID PANEL
Cholesterol: 246 mg/dL — ABNORMAL HIGH (ref 0–200)
HDL: 42.7 mg/dL (ref >39.00)
LDL Cholesterol: 180 mg/dL — ABNORMAL HIGH (ref 0–99)
NonHDL: 202.91
Total CHOL/HDL Ratio: 6
Triglycerides: 113 mg/dL (ref 0.0–149.0)
VLDL: 22.6 mg/dL (ref 0.0–40.0)

## 2022-07-15 LAB — COMPREHENSIVE METABOLIC PANEL
ALT: 130 U/L — ABNORMAL HIGH (ref 0–35)
AST: 38 U/L — ABNORMAL HIGH (ref 0–37)
Albumin: 4.2 g/dL (ref 3.5–5.2)
Alkaline Phosphatase: 60 U/L (ref 39–117)
BUN: 13 mg/dL (ref 6–23)
CO2: 25 mEq/L (ref 19–32)
Calcium: 9.5 mg/dL (ref 8.4–10.5)
Chloride: 105 mEq/L (ref 96–112)
Creatinine, Ser: 0.93 mg/dL (ref 0.40–1.20)
GFR: 72.18 mL/min (ref >60.00)
Glucose, Bld: 97 mg/dL (ref 70–99)
Potassium: 4.1 mEq/L (ref 3.5–5.1)
Sodium: 138 mEq/L (ref 135–145)
Total Bilirubin: 0.4 mg/dL (ref 0.2–1.2)
Total Protein: 7.5 g/dL (ref 6.0–8.3)

## 2022-07-15 LAB — URINALYSIS, MICROSCOPIC ONLY: RBC / HPF: NONE SEEN (ref 0–?)

## 2022-07-15 MED ORDER — CYCLOBENZAPRINE HCL 5 MG PO TABS: ORAL_TABLET | ORAL | 0 refills | Status: DC

## 2022-07-15 MED ORDER — LORAZEPAM 0.5 MG PO TABS
ORAL_TABLET | ORAL | 0 refills | Status: DC
Start: 2022-07-15 — End: 2022-08-13

## 2022-07-15 NOTE — Assessment & Plan Note (Signed)
Patient currently maintained on omeprazole 20 mg daily and Pepcid 20 mg as needed.  Continue medication as prescribed

## 2022-07-15 NOTE — Assessment & Plan Note (Signed)
History of the same Flexeril 5 mg as needed refill provided

## 2022-07-15 NOTE — Assessment & Plan Note (Signed)
Patient currently maintained on citalopram and lorazepam as needed.  Denies HI/SI/AVH.  Continue medication as prescribed

## 2022-07-15 NOTE — Patient Instructions (Signed)
Nice to see you today I will be in touch with the labs Follow up with me in 1 year, sooner if you need me

## 2022-07-15 NOTE — Assessment & Plan Note (Signed)
Discussed age-appropriate immunizations and screening exams.  Patient is up-to-date on all age-appropriate immunizations.  Cologuard ordered today for CRC screening.  Pap smear performed for cervical cancer screening today.  Mammogram ordered for breast cancer screening today.  Patient was given information at discharge about preventative healthcare maintenance with anticipatory guidance.  Continue working on lifestyle modifications.

## 2022-07-15 NOTE — Assessment & Plan Note (Signed)
History of the same patient still has some left-sided proptosis and facial droop.  Oropharynx rises symmetrically bilaterally

## 2022-07-15 NOTE — Assessment & Plan Note (Signed)
History of the same.  Patient has lost some weight since last office visit pending lipid panel today continue working lifestyle modifications

## 2022-07-15 NOTE — Progress Notes (Deleted)
Per orders of {lbscproviderlist:24760}, injection of *** given by Donnamarie Poag in {Left/right:33004} deltoid. Patient tolerated injection well. Patient will make appointment for {NUMBER 1-10:22536} {Time; day/week/month:13537}.

## 2022-07-15 NOTE — Assessment & Plan Note (Signed)
History of tobacco use.  Pending urine microscopy to rule out microscopic hematuria.

## 2022-07-15 NOTE — Progress Notes (Signed)
Established Patient Office Visit  Subjective   Patient ID: [name], female    DOB: 10/10/1970  Age: 52 y.o. MRN: [id number]  Chief Complaint  Patient presents with   Annual Exam    HPI  for complete physical and follow up of chronic conditions.   Anxiety/depression: Celexa and lorazepam as needed.  Patient seems to be doing well as HI/SI/AVH.  Prediates: was started on metofromin. States that she has stopped taking the medication. States that she had hot flushing from the chest up .  States that her left middle finger distally will change colors and go numb.  States is not happy that she stopped taking the metformin.  Immunizations: -Tetanus: Completed in 2023 -Influenza: Out of season -Shingles: Too young -Pneumonia: Too young    Diet: Fair diet. States that she is doing 2 meals a day and will do eggs ham or [place]. Dinner is what is made. She is having smaller portions. States that she has cut out all her sugar intake.  Exercise: No regular exercise. States that she has been doing up and down the stairs   Eye exam: Completes annually. Wears glasses.   Dental exam: Completes semi-annually    Colonoscopy: Due.  Patient would like to pursue cologuard  Lung Cancer Screening: N/A  Pap smear: over 10 years. Update today.  Patient has a history of abnormal Pap smear when she was 52 years old but since then normal as per her report.  Mammogram: Overdue patient like to go to [facility].  Ambulatory order placed today  Sleep: states that she will go to bed around 10 and having trouble going to sleep and when she sleeps she will wake up. Will get 4-5 hours of rest. States that she does feel rested. Can take a nap during the day        Review of Systems  Constitutional:  Negative for chills and fever.  Respiratory:  Negative for shortness of breath.   Cardiovascular:  Negative for chest pain and leg swelling.  Gastrointestinal:  Negative for abdominal pain, blood in  stool, constipation, diarrhea, nausea and vomiting.       BM daily   Genitourinary:  Negative for dysuria and hematuria.  Neurological:  Negative for tingling and headaches.  Psychiatric/Behavioral:  Negative for hallucinations and suicidal ideas.       Objective:     BP 130/78   Pulse 63   Temp 98.3 F (36.8 C)   Resp 16   Ht 5' 4.5" (1.638 m)   Wt 202 lb (91.6 kg)   SpO2 98%   BMI 34.14 kg/m  BP Readings from Last 3 Encounters:  07/15/22 130/78  01/13/22 120/88  10/12/21 110/70   Wt Readings from Last 3 Encounters:  07/15/22 202 lb (91.6 kg)  02/11/22 218 lb (98.9 kg)  01/13/22 220 lb (99.8 kg)      Physical Exam Vitals and nursing note reviewed. Exam conducted with a chaperone present [employer], CMA).  Constitutional:      Appearance: Normal appearance.  HENT:     Right Ear: Tympanic membrane, ear canal and external ear normal.     Left Ear: Tympanic membrane, ear canal and external ear normal.     Mouth/Throat:     Mouth: Mucous membranes are moist.     Pharynx: Oropharynx is clear.  Eyes:     Extraocular Movements: Extraocular movements intact.     Pupils: Pupils are equal, round, and reactive to light.  Cardiovascular:     Rate and Rhythm: Normal rate and regular rhythm.     Pulses: Normal pulses.     Heart sounds: Normal heart sounds.  Pulmonary:     Effort: Pulmonary effort is normal.     Breath sounds: Normal breath sounds.  Abdominal:     General: Bowel sounds are normal. There is no distension.     Palpations: There is no mass.     Tenderness: There is no abdominal tenderness.     Hernia: No hernia is present.  Genitourinary:    Exam position: Lithotomy position.     Vagina: Normal.     Cervix: Normal.     Uterus: Normal.      Adnexa: Right adnexa normal and left adnexa normal.  Musculoskeletal:     Right lower leg: No edema.     Left lower leg: No edema.  Lymphadenopathy:     Cervical: No cervical adenopathy.  Skin:    General:  Skin is warm.  Neurological:     General: No focal deficit present.     Mental Status: She is alert.     Cranial Nerves: Facial asymmetry (left sided droop s/p bells palsy) present.     Deep Tendon Reflexes:     Reflex Scores:      Bicep reflexes are 2+ on the right side and 2+ on the left side.      Patellar reflexes are 2+ on the right side and 2+ on the left side.    Comments: Bilateral upper and lower extremity strength 5/5  Psychiatric:        Mood and Affect: Mood normal.        Behavior: Behavior normal.        Thought Content: Thought content normal.        Judgment: Judgment normal.      No results found for any visits on 07/15/22.    The 10-year ASCVD risk score (Arnett DK, et al., 2019) is: 1.8%    Assessment & Plan:   Problem List Items Addressed This Visit       Digestive   Gastroesophageal reflux disease    Patient currently maintained on omeprazole 20 mg daily and Pepcid 20 mg as needed.  Continue medication as prescribed        Nervous and Auditory   Bell's palsy - Primary    History of the same patient still has some left-sided proptosis and facial droop.  Oropharynx rises symmetrically bilaterally      Relevant Medications   cyclobenzaprine (FLEXERIL) 5 MG tablet   LORazepam (ATIVAN) 0.5 MG tablet     Other   GAD (generalized anxiety disorder)    Patient currently maintained on citalopram and lorazepam as needed.  Denies HI/SI/AVH.  Continue medication as prescribed      Relevant Medications   LORazepam (ATIVAN) 0.5 MG tablet   Muscle tightness    History of the same Flexeril 5 mg as needed refill provided      Relevant Medications   cyclobenzaprine (FLEXERIL) 5 MG tablet   Preventative health care    Discussed age-appropriate immunizations and screening exams.  Patient is up-to-date on all age-appropriate immunizations.  Cologuard ordered today for CRC screening.  Pap smear performed for cervical cancer screening today.  Mammogram ordered  for breast cancer screening today.  Patient was given information at discharge about preventative healthcare maintenance with anticipatory guidance.  Continue working on lifestyle modifications.      Relevant Orders  CBC   Pre-diabetes    History of the same.  Patient was on metformin but had adverse drug events and has discontinued.  Last A1c of 6.3% check glucose level and blood work today      Pure hypercholesterolemia    History of the same.  Patient has lost some weight since last office visit pending lipid panel today continue working lifestyle modifications      Relevant Orders   Lipid panel   Comprehensive metabolic panel   Former tobacco use    History of tobacco use.  Pending urine microscopy to rule out microscopic hematuria.      Relevant Orders   Urine Microscopic   Other Visit Diagnoses     Cervical cancer screening       Relevant Orders   Cytology - PAP([place])   Screening for colon cancer       Relevant Orders   Cologuard   Screening mammogram for breast cancer       Relevant Orders   MM 3D SCREENING MAMMOGRAM BILATERAL BREAST       Return in about 1 year (around 07/15/2023) for CPE and Labs.    Audria Nine, NP

## 2022-07-15 NOTE — Assessment & Plan Note (Signed)
History of the same.  Patient was on metformin but had adverse drug events and has discontinued.  Last A1c of 6.3% check glucose level and blood work today

## 2022-07-20 ENCOUNTER — Other Ambulatory Visit: Payer: Self-pay | Admitting: Nurse Practitioner

## 2022-07-20 DIAGNOSIS — R7989 Other specified abnormal findings of blood chemistry: Secondary | ICD-10-CM

## 2022-07-21 LAB — CYTOLOGY - PAP
Comment: NEGATIVE
Diagnosis: NEGATIVE
High risk HPV: NEGATIVE

## 2022-08-03 ENCOUNTER — Other Ambulatory Visit: Payer: Self-pay | Admitting: Nurse Practitioner

## 2022-08-05 ENCOUNTER — Telehealth: Payer: Self-pay | Admitting: Nurse Practitioner

## 2022-08-05 NOTE — Telephone Encounter (Signed)
[employer] called regarding ICD codes on a cologuard order.  [id number] Ref# [id number]

## 2022-08-05 NOTE — Telephone Encounter (Signed)
Called and updated the ICD 1- code to Z12.11

## 2022-08-13 ENCOUNTER — Other Ambulatory Visit: Payer: Self-pay | Admitting: Nurse Practitioner

## 2022-08-13 DIAGNOSIS — F411 Generalized anxiety disorder: Secondary | ICD-10-CM

## 2022-08-13 DIAGNOSIS — M6289 Other specified disorders of muscle: Secondary | ICD-10-CM

## 2022-09-07 DIAGNOSIS — Z1211 Encounter for screening for malignant neoplasm of colon: Secondary | ICD-10-CM | POA: Diagnosis not present

## 2022-09-12 ENCOUNTER — Other Ambulatory Visit: Payer: Self-pay | Admitting: Nurse Practitioner

## 2022-09-12 DIAGNOSIS — F411 Generalized anxiety disorder: Secondary | ICD-10-CM

## 2022-09-12 DIAGNOSIS — M6289 Other specified disorders of muscle: Secondary | ICD-10-CM

## 2022-09-15 LAB — COLOGUARD: COLOGUARD: NEGATIVE

## 2022-10-18 ENCOUNTER — Other Ambulatory Visit: Payer: Self-pay | Admitting: Nurse Practitioner

## 2022-10-18 DIAGNOSIS — F411 Generalized anxiety disorder: Secondary | ICD-10-CM

## 2022-10-18 DIAGNOSIS — M6289 Other specified disorders of muscle: Secondary | ICD-10-CM

## 2022-10-19 NOTE — Telephone Encounter (Signed)
Ativan last filled 09/13/22 #20 0rf Flexeril last filled 09/13/22 #30 0rf   Last seen for CPE 07/15/22 no f/u scheduled.

## 2022-10-31 ENCOUNTER — Encounter: Payer: Self-pay | Admitting: Nurse Practitioner

## 2022-11-02 ENCOUNTER — Other Ambulatory Visit (INDEPENDENT_AMBULATORY_CARE_PROVIDER_SITE_OTHER): Payer: Medicaid Other

## 2022-11-02 ENCOUNTER — Other Ambulatory Visit: Payer: Self-pay | Admitting: Nurse Practitioner

## 2022-11-02 DIAGNOSIS — R7989 Other specified abnormal findings of blood chemistry: Secondary | ICD-10-CM

## 2022-11-02 DIAGNOSIS — Z7251 High risk heterosexual behavior: Secondary | ICD-10-CM | POA: Diagnosis not present

## 2022-11-02 DIAGNOSIS — K219 Gastro-esophageal reflux disease without esophagitis: Secondary | ICD-10-CM

## 2022-11-02 LAB — HEPATIC FUNCTION PANEL
ALT: 39 U/L — ABNORMAL HIGH (ref 0–35)
AST: 24 U/L (ref 0–37)
Albumin: 4.1 g/dL (ref 3.5–5.2)
Alkaline Phosphatase: 64 U/L (ref 39–117)
Bilirubin, Direct: 0.1 mg/dL (ref 0.0–0.3)
Total Bilirubin: 0.4 mg/dL (ref 0.2–1.2)
Total Protein: 7.4 g/dL (ref 6.0–8.3)

## 2022-11-03 LAB — HSV(HERPES SIMPLEX VRS) I + II AB-IGG
HSV 1 IGG,TYPE SPECIFIC AB: 21.7 {index} — ABNORMAL HIGH
HSV 2 IGG,TYPE SPECIFIC AB: 3.77 {index} — ABNORMAL HIGH

## 2022-11-26 ENCOUNTER — Other Ambulatory Visit: Payer: Self-pay | Admitting: Nurse Practitioner

## 2022-11-26 DIAGNOSIS — F411 Generalized anxiety disorder: Secondary | ICD-10-CM

## 2022-11-26 DIAGNOSIS — M6289 Other specified disorders of muscle: Secondary | ICD-10-CM

## 2023-01-06 ENCOUNTER — Other Ambulatory Visit: Payer: Self-pay | Admitting: Nurse Practitioner

## 2023-01-06 DIAGNOSIS — M6289 Other specified disorders of muscle: Secondary | ICD-10-CM

## 2023-01-06 DIAGNOSIS — F411 Generalized anxiety disorder: Secondary | ICD-10-CM

## 2023-01-13 ENCOUNTER — Encounter: Payer: Self-pay | Admitting: Nurse Practitioner

## 2023-01-13 NOTE — Telephone Encounter (Signed)
Called patient set up to see Dr. Ermalene Searing tomorrow. She will call if any questions.

## 2023-01-14 ENCOUNTER — Ambulatory Visit: Payer: Medicaid Other | Admitting: Family Medicine

## 2023-01-14 ENCOUNTER — Encounter: Payer: Self-pay | Admitting: Family Medicine

## 2023-01-14 VITALS — BP 120/72 | HR 91 | Temp 97.8°F | Ht 64.5 in | Wt 197.1 lb

## 2023-01-14 DIAGNOSIS — L089 Local infection of the skin and subcutaneous tissue, unspecified: Secondary | ICD-10-CM | POA: Diagnosis not present

## 2023-01-14 MED ORDER — DOXYCYCLINE HYCLATE 100 MG PO TABS: 100.0000 mg | ORAL_TABLET | Freq: Two times a day (BID) | ORAL | 0 refills | Status: DC

## 2023-01-14 MED ORDER — MUPIROCIN 2 % EX OINT: 1.0000 | TOPICAL_OINTMENT | Freq: Every day | CUTANEOUS | 0 refills | Status: DC | PRN

## 2023-01-14 NOTE — Patient Instructions (Signed)
Start warm compress 3-4 times daily.  Wash with antibacterial soap, sanitize personal bathroom items.  Complete 10 day course of doxycyline.  Can use topical antibiotic cream to treat.

## 2023-01-14 NOTE — Progress Notes (Signed)
Patient ID: [name], female    DOB: [birth year], 52 y.o.   MRN: [id number]  This visit was conducted in person.  BP 120/72 (BP Location: Left Arm, Patient Position: Sitting, Cuff Size: Large)   Pulse 91   Temp 97.8 F (36.6 C) (Temporal)   Ht 5' 4.5" (1.638 m)   Wt 197 lb 2 oz (89.4 kg)   SpO2 98%   BMI 33.31 kg/m    CC:  Chief Complaint  Patient presents with   Recurrent Skin Infections    Boils-Pubic Area    Subjective:   HPI: [name] is a 52 y.o. female  patient of   Audria Nine presenting on 01/14/2023 for Recurrent Skin Infections (Boils-Pubic Area)   Has history of boils in past.. usually resolved with alcohol and cleaning.   She has recently noted recurrence of intermittent small boils in last 6 months.  Usually drains on its own and then resolves.   Now in last sore area x 2  in last 2 week on mons pubis.Marland Kitchen not resolved with usually treatment.   Some discharge from one in last weelk.   No abd pain, no fever.   No past MRSA,  no family history of recurrent boils, cousin 12 years ago with MRSA.    Using dial or DOve.  Relevant past medical, surgical, family and social history reviewed and updated as indicated. Interim medical history since our last visit reviewed. Allergies and medications reviewed and updated. Outpatient Medications Prior to Visit  Medication Sig Dispense Refill   albuterol (VENTOLIN HFA) 108 (90 Base) MCG/ACT inhaler Inhale 2 puffs into the lungs every 6 (six) hours as needed for wheezing or shortness of breath. 8 g 0   benzonatate (TESSALON) 200 MG capsule Take 1 capsule (200 mg total) by mouth 3 (three) times daily as needed for cough. 21 capsule 0   cyclobenzaprine (FLEXERIL) 5 MG tablet TAKE 1 TABLET BY MOUTH 2 TIMES DAILY AS NEEDED FOR MUSCLE SPASMS. 30 tablet 0   famotidine (PEPCID) 20 MG tablet TAKE 1 TAB BY MOUTH DAILY AS NEEDED FOR HEARTBURN OR INDIGESTION. 90 tablet 0   hydrOXYzine (VISTARIL) 25 MG capsule TAKE 1  CAPSULE (25 MG TOTAL) BY MOUTH TWICE A DAY AS NEEDED 180 capsule 1   LORazepam (ATIVAN) 0.5 MG tablet TAKE 1 TABLET BY MOUTH TWICE A DAY AS NEEDED FOR ANXIETY 20 tablet 0   omeprazole (PRILOSEC) 20 MG capsule TAKE 1 CAPSULE BY MOUTH EVERY DAY 90 capsule 1   triamcinolone cream (KENALOG) 0.1 % Apply 1 Application topically 2 (two) times daily. 30 g 0   citalopram (CELEXA) 10 MG tablet TAKE 1 TABLET BY MOUTH EVERY DAY 90 tablet 2   No facility-administered medications prior to visit.     Per HPI unless specifically indicated in ROS section below Review of Systems  Constitutional:  Negative for fatigue and fever.  HENT:  Negative for congestion.   Eyes:  Negative for pain.  Respiratory:  Negative for cough and shortness of breath.   Cardiovascular:  Negative for chest pain, palpitations and leg swelling.  Gastrointestinal:  Negative for abdominal pain.  Genitourinary:  Negative for dysuria and vaginal bleeding.  Musculoskeletal:  Negative for back pain.  Neurological:  Negative for syncope, light-headedness and headaches.  Psychiatric/Behavioral:  Negative for dysphoric mood.    Objective:  BP 120/72 (BP Location: Left Arm, Patient Position: Sitting, Cuff Size: Large)   Pulse 91   Temp 97.8 F (36.6  C) (Temporal)   Ht 5' 4.5" (1.638 m)   Wt 197 lb 2 oz (89.4 kg)   SpO2 98%   BMI 33.31 kg/m   Wt Readings from Last 3 Encounters:  01/14/23 197 lb 2 oz (89.4 kg)  07/15/22 202 lb (91.6 kg)  02/11/22 218 lb (98.9 kg)      Physical Exam Constitutional:      General: She is not in acute distress.    Appearance: Normal appearance. She is well-developed. She is not ill-appearing or toxic-appearing.  HENT:     Head: Normocephalic.     Right Ear: Hearing, tympanic membrane, ear canal and external ear normal. Tympanic membrane is not erythematous, retracted or bulging.     Left Ear: Hearing, tympanic membrane, ear canal and external ear normal. Tympanic membrane is not erythematous,  retracted or bulging.     Nose: No mucosal edema or rhinorrhea.     Right Sinus: No maxillary sinus tenderness or frontal sinus tenderness.     Left Sinus: No maxillary sinus tenderness or frontal sinus tenderness.     Mouth/Throat:     Mouth: Oropharynx is clear and moist and mucous membranes are normal.     Pharynx: Uvula midline.  Eyes:     General: Lids are normal. Lids are everted, no foreign bodies appreciated.     Extraocular Movements: EOM normal.     Conjunctiva/sclera: Conjunctivae normal.     Pupils: Pupils are equal, round, and reactive to light.  Neck:     Thyroid: No thyroid mass or thyromegaly.     Vascular: No carotid bruit.     Trachea: Trachea normal.  Cardiovascular:     Rate and Rhythm: Normal rate and regular rhythm.     Pulses: Normal pulses.     Heart sounds: Normal heart sounds, S1 normal and S2 normal. No murmur heard.    No friction rub. No gallop.  Pulmonary:     Effort: Pulmonary effort is normal. No tachypnea or respiratory distress.     Breath sounds: Normal breath sounds. No decreased breath sounds, wheezing, rhonchi or rales.  Abdominal:     General: Bowel sounds are normal.     Palpations: Abdomen is soft.     Tenderness: There is no abdominal tenderness.  Musculoskeletal:     Cervical back: Normal range of motion and neck supple.  Skin:    General: Skin is warm, dry and intact.     Findings: Lesion and rash present.       Neurological:     Mental Status: She is alert.  Psychiatric:        Mood and Affect: Mood is not anxious or depressed.        Speech: Speech normal.        Behavior: Behavior normal. Behavior is cooperative.        Thought Content: Thought content normal.        Cognition and Memory: Cognition and memory normal.        Judgment: Judgment normal.       Results for orders placed or performed in visit on 11/02/22  Hepatic function panel  Result Value Ref Range   Total Bilirubin 0.4 0.2 - 1.2 mg/dL   Bilirubin, Direct  0.1 0.0 - 0.3 mg/dL   Alkaline Phosphatase 64 39 - 117 U/L   AST 24 0 - 37 U/L   ALT 39 (H) 0 - 35 U/L   Total Protein 7.4 6.0 - 8.3 g/dL  Albumin 4.1 3.5 - 5.2 g/dL  HSV(herpes simplex vrs) 1+2 ab-IgG  Result Value Ref Range   HSV 1 IGG,TYPE SPECIFIC AB 21.70 (H) index   HSV 2 IGG,TYPE SPECIFIC AB 3.77 (H) index    Assessment and Plan  Recurrent infection of skin Assessment & Plan: Acute flare of chronic intermittent issue   Start warm compress 3-4 times daily.  Wash with antibacterial soap, sanitize personal bathroom items.  Complete 10 day course of doxycyline.  Can use topical antibiotic cream to treat.   Other orders -     Doxycycline Hyclate; Take 1 tablet (100 mg total) by mouth 2 (two) times daily.  Dispense: 20 tablet; Refill: 0 -     Mupirocin; Apply 1 Application topically daily as needed.  Dispense: 22 g; Refill: 0    No follow-ups on file.   Kerby Nora, MD

## 2023-01-31 DIAGNOSIS — L089 Local infection of the skin and subcutaneous tissue, unspecified: Secondary | ICD-10-CM | POA: Insufficient documentation

## 2023-01-31 NOTE — Assessment & Plan Note (Addendum)
Acute flare of chronic intermittent issue   Start warm compress 3-4 times daily.  Wash with antibacterial soap, sanitize personal bathroom items.  Complete 10 day course of doxycyline.  Can use topical antibiotic cream to treat.  Return and ER precautions provided.

## 2023-02-08 ENCOUNTER — Other Ambulatory Visit: Payer: Self-pay | Admitting: Nurse Practitioner

## 2023-02-08 DIAGNOSIS — F411 Generalized anxiety disorder: Secondary | ICD-10-CM

## 2023-02-08 DIAGNOSIS — M6289 Other specified disorders of muscle: Secondary | ICD-10-CM

## 2023-03-16 ENCOUNTER — Other Ambulatory Visit: Payer: Self-pay | Admitting: Nurse Practitioner

## 2023-03-16 DIAGNOSIS — F411 Generalized anxiety disorder: Secondary | ICD-10-CM

## 2023-03-16 DIAGNOSIS — M6289 Other specified disorders of muscle: Secondary | ICD-10-CM

## 2023-03-16 DIAGNOSIS — K219 Gastro-esophageal reflux disease without esophagitis: Secondary | ICD-10-CM

## 2023-05-02 ENCOUNTER — Other Ambulatory Visit: Payer: Self-pay | Admitting: Nurse Practitioner

## 2023-05-02 DIAGNOSIS — M6289 Other specified disorders of muscle: Secondary | ICD-10-CM

## 2023-05-02 DIAGNOSIS — F411 Generalized anxiety disorder: Secondary | ICD-10-CM

## 2023-05-06 ENCOUNTER — Other Ambulatory Visit: Payer: Self-pay | Admitting: Family Medicine

## 2023-05-06 NOTE — Telephone Encounter (Signed)
 Last office visit 01/14/2023 with Dr. Ermalene Searing for recurrent skin infection.  Last refilled 01/14/2023 for 22 g with no refills.  Okay to refill.

## 2023-05-09 ENCOUNTER — Other Ambulatory Visit: Payer: Self-pay | Admitting: Nurse Practitioner

## 2023-05-09 NOTE — Telephone Encounter (Signed)
 Needs CPE scheduled for 07/16/23 or  a little after

## 2023-05-09 NOTE — Telephone Encounter (Signed)
 Lvm to schedule

## 2023-06-13 ENCOUNTER — Other Ambulatory Visit: Payer: Self-pay | Admitting: Nurse Practitioner

## 2023-06-13 DIAGNOSIS — F411 Generalized anxiety disorder: Secondary | ICD-10-CM

## 2023-06-13 DIAGNOSIS — M6289 Other specified disorders of muscle: Secondary | ICD-10-CM

## 2023-06-16 ENCOUNTER — Other Ambulatory Visit: Payer: Self-pay | Admitting: Nurse Practitioner

## 2023-06-16 DIAGNOSIS — K219 Gastro-esophageal reflux disease without esophagitis: Secondary | ICD-10-CM

## 2023-07-06 ENCOUNTER — Other Ambulatory Visit: Payer: Self-pay | Admitting: Nurse Practitioner

## 2023-07-06 DIAGNOSIS — M6289 Other specified disorders of muscle: Secondary | ICD-10-CM

## 2023-07-07 ENCOUNTER — Other Ambulatory Visit: Payer: Self-pay | Admitting: Nurse Practitioner

## 2023-07-07 DIAGNOSIS — F411 Generalized anxiety disorder: Secondary | ICD-10-CM

## 2023-07-08 NOTE — Telephone Encounter (Signed)
 Patient is due for a CPE. Can we schedule within 30 days to continue getting refills

## 2023-07-08 NOTE — Telephone Encounter (Signed)
 lvm for pt to call office to schedule appt.

## 2023-07-11 NOTE — Telephone Encounter (Signed)
 Lvmtcb, sent mychart message

## 2023-07-12 NOTE — Telephone Encounter (Signed)
 Spoke to pt, via mychart, scheduled cpe for 07/27/23

## 2023-07-12 NOTE — Telephone Encounter (Signed)
 Lvmtcb 3rd attempt.

## 2023-07-27 ENCOUNTER — Encounter: Payer: Self-pay | Admitting: Nurse Practitioner

## 2023-07-27 ENCOUNTER — Ambulatory Visit (INDEPENDENT_AMBULATORY_CARE_PROVIDER_SITE_OTHER): Admitting: Nurse Practitioner

## 2023-07-27 ENCOUNTER — Telehealth: Payer: Self-pay

## 2023-07-27 VITALS — BP 114/80 | HR 80 | Temp 98.1°F | Ht 64.0 in | Wt 209.8 lb

## 2023-07-27 DIAGNOSIS — Z0000 Encounter for general adult medical examination without abnormal findings: Secondary | ICD-10-CM | POA: Diagnosis not present

## 2023-07-27 DIAGNOSIS — K219 Gastro-esophageal reflux disease without esophagitis: Secondary | ICD-10-CM | POA: Diagnosis not present

## 2023-07-27 DIAGNOSIS — Z1231 Encounter for screening mammogram for malignant neoplasm of breast: Secondary | ICD-10-CM

## 2023-07-27 DIAGNOSIS — Z1159 Encounter for screening for other viral diseases: Secondary | ICD-10-CM

## 2023-07-27 DIAGNOSIS — F411 Generalized anxiety disorder: Secondary | ICD-10-CM

## 2023-07-27 DIAGNOSIS — R7303 Prediabetes: Secondary | ICD-10-CM

## 2023-07-27 DIAGNOSIS — Z13820 Encounter for screening for osteoporosis: Secondary | ICD-10-CM | POA: Diagnosis not present

## 2023-07-27 DIAGNOSIS — Z114 Encounter for screening for human immunodeficiency virus [HIV]: Secondary | ICD-10-CM | POA: Diagnosis not present

## 2023-07-27 DIAGNOSIS — E7800 Pure hypercholesterolemia, unspecified: Secondary | ICD-10-CM | POA: Diagnosis not present

## 2023-07-27 DIAGNOSIS — E669 Obesity, unspecified: Secondary | ICD-10-CM | POA: Diagnosis not present

## 2023-07-27 DIAGNOSIS — Z87891 Personal history of nicotine dependence: Secondary | ICD-10-CM | POA: Diagnosis not present

## 2023-07-27 LAB — CBC
HCT: 44.1 % (ref 36.0–46.0)
Hemoglobin: 14.6 g/dL (ref 12.0–15.0)
MCHC: 33.2 g/dL (ref 30.0–36.0)
MCV: 90.7 fl (ref 78.0–100.0)
Platelets: 262 10*3/uL (ref 150.0–400.0)
RBC: 4.87 Mil/uL (ref 3.87–5.11)
RDW: 13 % (ref 11.5–15.5)
WBC: 8.6 10*3/uL (ref 4.0–10.5)

## 2023-07-27 LAB — COMPREHENSIVE METABOLIC PANEL WITH GFR
ALT: 48 U/L — ABNORMAL HIGH (ref 0–35)
AST: 33 U/L (ref 0–37)
Albumin: 4.5 g/dL (ref 3.5–5.2)
Alkaline Phosphatase: 66 U/L (ref 39–117)
BUN: 17 mg/dL (ref 6–23)
CO2: 30 meq/L (ref 19–32)
Calcium: 10 mg/dL (ref 8.4–10.5)
Chloride: 102 meq/L (ref 96–112)
Creatinine, Ser: 0.84 mg/dL (ref 0.40–1.20)
GFR: 80.97 mL/min (ref >60.00)
Glucose, Bld: 98 mg/dL (ref 70–99)
Potassium: 4.2 meq/L (ref 3.5–5.1)
Sodium: 140 meq/L (ref 135–145)
Total Bilirubin: 0.5 mg/dL (ref 0.2–1.2)
Total Protein: 7.5 g/dL (ref 6.0–8.3)

## 2023-07-27 LAB — LIPID PANEL
Cholesterol: 242 mg/dL — ABNORMAL HIGH (ref 0–200)
HDL: 50.8 mg/dL (ref >39.00)
LDL Cholesterol: 165 mg/dL — ABNORMAL HIGH (ref 0–99)
NonHDL: 191.25
Total CHOL/HDL Ratio: 5
Triglycerides: 130 mg/dL (ref 0.0–149.0)
VLDL: 26 mg/dL (ref 0.0–40.0)

## 2023-07-27 LAB — URINALYSIS, MICROSCOPIC ONLY: RBC / HPF: NONE SEEN (ref 0–?)

## 2023-07-27 LAB — TSH: TSH: 1.15 u[IU]/mL (ref 0.35–5.50)

## 2023-07-27 LAB — HEMOGLOBIN A1C: Hgb A1c MFr Bld: 6.6 % — ABNORMAL HIGH (ref 4.6–6.5)

## 2023-07-27 MED ORDER — WEGOVY 0.25 MG/0.5ML ~~LOC~~ SOAJ: 0.2500 mg | SUBCUTANEOUS | 0 refills | Status: DC

## 2023-07-27 NOTE — Assessment & Plan Note (Signed)
 Patient currently maintained on omeprazole  20 mg daily and Pepcid  20 mg as needed.  Seems to be well-controlled at this juncture.  Continue medication as prescribed

## 2023-07-27 NOTE — Assessment & Plan Note (Signed)
 History of the same.  Patient to continue working on healthy lifestyle modifications.  Pending A1c, TSH, lipid panel.  Patient denies personal or family history of medullary thyroid  cancer or multiple endocrine neoplasia syndrome type II.  Will trial patient on Wegovy 0.25 mg weekly with anticipation to titrate up every month as long as patient tolerates medication

## 2023-07-27 NOTE — Progress Notes (Signed)
 Established Patient Office Visit  Subjective   Patient ID: [name], female    DOB: 07/10/1970  Age: 53 y.o. MRN: [id number]  Chief Complaint  Patient presents with   Annual Exam    Pt requests mammogram to be done. Hiv and hep c screening    HPI  for complete physical and follow up of chronic conditions.  Asthma: once to twice a month. States that she will choke then will wheeze.  That is when she will use her inhaler patient denies any PND  GERD: ompearzole daily and pepcid  as needed. She does not have to change her diet.   GAD: she is using lorazepam  as needed  Immunizations: -Tetanus: Completed in 2023 -Influenza: out of season  -Shingles: Discussed in office -Pneumonia: Too young  Diet: Fair diet. She is eating 2 meals a day. She is snacking some. Sttes that she has not had sweet cravings. She is drinking OJ, and water, sweet tea  Exercise: No regular exercise. 20 mins walking and she has a puppy and is out with her outside   Eye exam: Completes annually. Glasses   Dental exam: Completes semi-annually    Colonoscopy: Completed Cologuard in 09/07/2022 that was negative  Lung Cancer Screening: N/A  Pap smear: 07/15/2022, negative cells and negative HPV  Mammogram: Order placed  DEXA: no longer has periods. Needs.  Order placed  Sleep: going to bed around 9-10 and will get up around 5-6. Feels rested most nights. States that she has changed her diet and has not been urinating as much at nihgt        Review of Systems  Constitutional:  Negative for chills and fever.  Respiratory:  Negative for shortness of breath.   Cardiovascular:  Negative for chest pain and leg swelling.  Gastrointestinal:  Negative for abdominal pain, blood in stool, constipation, diarrhea, nausea and vomiting.       Bm daily   Genitourinary:  Negative for dysuria and hematuria.  Neurological:  Positive for tingling (hands and arms when she wakes up). Negative for dizziness and  headaches.  Psychiatric/Behavioral:  Negative for hallucinations and suicidal ideas.       Objective:     BP 114/80   Pulse 80   Temp 98.1 F (36.7 C) (Oral)   Ht 5\' 4"  (1.626 m)   Wt 209 lb 12.8 oz (95.2 kg)   SpO2 98%   BMI 36.01 kg/m  BP Readings from Last 3 Encounters:  07/27/23 114/80  01/14/23 120/72  07/15/22 130/78   Wt Readings from Last 3 Encounters:  07/27/23 209 lb 12.8 oz (95.2 kg)  01/14/23 197 lb 2 oz (89.4 kg)  07/15/22 202 lb (91.6 kg)   SpO2 Readings from Last 3 Encounters:  07/27/23 98%  01/14/23 98%  07/15/22 98%      Physical Exam Vitals and nursing note reviewed.  Constitutional:      Appearance: Normal appearance.  HENT:     Right Ear: Tympanic membrane, ear canal and external ear normal.     Left Ear: Tympanic membrane, ear canal and external ear normal.     Mouth/Throat:     Mouth: Mucous membranes are moist.     Pharynx: Oropharynx is clear.  Eyes:     Extraocular Movements: Extraocular movements intact.     Pupils: Pupils are equal, round, and reactive to light.  Cardiovascular:     Rate and Rhythm: Normal rate and regular rhythm.     Pulses: Normal pulses.  Heart sounds: Normal heart sounds.  Pulmonary:     Effort: Pulmonary effort is normal.     Breath sounds: Normal breath sounds.  Abdominal:     General: Bowel sounds are normal. There is no distension.     Palpations: There is no mass.     Tenderness: There is no abdominal tenderness.     Hernia: No hernia is present.  Musculoskeletal:     Right lower leg: No edema.     Left lower leg: No edema.  Lymphadenopathy:     Cervical: No cervical adenopathy.  Skin:    General: Skin is warm.  Neurological:     General: No focal deficit present.     Mental Status: She is alert.     Deep Tendon Reflexes:     Reflex Scores:      Bicep reflexes are 2+ on the right side and 2+ on the left side.      Patellar reflexes are 2+ on the right side and 2+ on the left side.     Comments: Bilateral upper and lower extremity strength 5/5  Psychiatric:        Mood and Affect: Mood normal.        Behavior: Behavior normal.        Thought Content: Thought content normal.        Judgment: Judgment normal.      No results found for any visits on 07/27/23.    The 10-year ASCVD risk score (Arnett DK, et al., 2019) is: 1.8%    Assessment & Plan:   Problem List Items Addressed This Visit       Digestive   Gastroesophageal reflux disease   Patient currently maintained on omeprazole  20 mg daily and Pepcid  20 mg as needed.  Seems to be well-controlled at this juncture.  Continue medication as prescribed        Other   Obesity (BMI 30-39.9)   History of the same.  Patient to continue working on healthy lifestyle modifications.  Pending A1c, TSH, lipid panel.  Patient denies personal or family history of medullary thyroid  cancer or multiple endocrine neoplasia syndrome type II.  Will trial patient on Wegovy 0.25 mg weekly with anticipation to titrate up every month as long as patient tolerates medication      Relevant Medications   Semaglutide-Weight Management (WEGOVY) 0.25 MG/0.5ML SOAJ   GAD (generalized anxiety disorder)   History of the same.  Patient currently maintained on lorazepam  as needed.  Continue patient denies HI/SI/AVH.      Relevant Orders   TSH   Preventative health care - Primary   Discussed age-appropriate immunizations and screening exams.  Did review patient's personal, surgical, social, family histories.  Patient is up-to-date on all age-appropriate vaccinations she would like.  We did discuss shingles vaccine in office she will get at her local pharmacy.  Patient is up-to-date on CRC screening and cervical cancer screening.  Order placed for mammogram for breast cancer screening and DEXA scan for osteoporosis screening.  Patient was given information at discharge about preventative healthcare maintenance with anticipatory guidance.       Relevant Orders   CBC   Comprehensive metabolic panel with GFR   TSH   Pre-diabetes   History of the same.  Pending A1c      Relevant Orders   Hemoglobin A1c   Pure hypercholesterolemia   History of the same.  Continue working healthy lifestyle modifications.  Pending lipid panel today  Relevant Orders   Lipid panel   Former tobacco use   Pending urine microscopy rule out microscopic hematuria      Relevant Orders   Urine Microscopic   Other Visit Diagnoses       Screening mammogram for breast cancer       Relevant Orders   MM 3D SCREENING MAMMOGRAM BILATERAL BREAST     Encounter for screening for HIV       Relevant Orders   HIV antibody (with reflex)     Encounter for hepatitis C screening test for low risk patient       Relevant Orders   Hepatitis C Antibody     Screening for osteoporosis       Relevant Orders   DG Bone Density       Return in about 3 months (around 10/27/2023) for weight .    Margarie Shay, NP

## 2023-07-27 NOTE — Assessment & Plan Note (Signed)
 Pending urine microscopy rule out microscopic hematuria

## 2023-07-27 NOTE — Assessment & Plan Note (Signed)
 Discussed age-appropriate immunizations and screening exams.  Did review patient's personal, surgical, social, family histories.  Patient is up-to-date on all age-appropriate vaccinations she would like.  We did discuss shingles vaccine in office she will get at her local pharmacy.  Patient is up-to-date on CRC screening and cervical cancer screening.  Order placed for mammogram for breast cancer screening and DEXA scan for osteoporosis screening.  Patient was given information at discharge about preventative healthcare maintenance with anticipatory guidance.

## 2023-07-27 NOTE — Assessment & Plan Note (Signed)
 History of the same.  Continue working healthy lifestyle modifications.  Pending lipid panel today

## 2023-07-27 NOTE — Patient Instructions (Addendum)
 Nice to see you toyda I will be in touch with the labs once I have reviewed them  Follow up with me in 3 months, sooner If you need me   Consider getting the shingles vaccine at the pharmacy   Call and schedule mammogram and bone density scan at   [facility] [address] [place] [address] [place] [address]  [phone]

## 2023-07-27 NOTE — Assessment & Plan Note (Signed)
 History of the same.  Patient currently maintained on lorazepam  as needed.  Continue patient denies HI/SI/AVH.

## 2023-07-27 NOTE — Telephone Encounter (Signed)
 Pharmacy Patient Advocate Encounter   Received notification from CoverMyMeds that prior authorization for Wegovy 0.25MG /0.5ML auto-injectors  is required/requested.   Insurance verification completed.   The patient is insured through [facility] .   Per test claim: PA required; PA started via CoverMyMeds. KEY [id number] . Waiting for clinical questions to populate.

## 2023-07-27 NOTE — Assessment & Plan Note (Signed)
History of the same.  Pending A1c

## 2023-07-28 LAB — HIV ANTIBODY (ROUTINE TESTING W REFLEX): HIV 1&2 Ab, 4th Generation: NONREACTIVE

## 2023-07-28 LAB — HEPATITIS C ANTIBODY: Hepatitis C Ab: NONREACTIVE

## 2023-07-28 NOTE — Telephone Encounter (Signed)
 PLEASE BE ADVISED Clinical questions have been answered and PA submitted.TO PLAN. PA currently Pending.

## 2023-07-29 NOTE — Telephone Encounter (Signed)
 Pharmacy Patient Advocate Encounter  Received notification from [employer] that Prior Authorization for Wegovy 0.25MG /0.5ML auto-injectors has been APPROVED from 07/28/2023 to 01/24/2024  SEE OUTCOME BELOW Message from plan: PA Case: [id number], Status: Approved, Coverage Starts on: 07/28/2023 12:00:00 AM, Coverage Ends on: 01/24/2024 12:00:00 AM.. Authorization Expiration Date: January 24, 2024.   PA #/Case ID/Reference #:  [id number]

## 2023-07-31 ENCOUNTER — Encounter: Payer: Self-pay | Admitting: Nurse Practitioner

## 2023-08-01 ENCOUNTER — Ambulatory Visit: Payer: Self-pay | Admitting: Nurse Practitioner

## 2023-08-01 DIAGNOSIS — E119 Type 2 diabetes mellitus without complications: Secondary | ICD-10-CM | POA: Insufficient documentation

## 2023-08-01 MED ORDER — ONDANSETRON 4 MG PO TBDP: 4.0000 mg | ORAL_TABLET | Freq: Three times a day (TID) | ORAL | 0 refills | Status: DC | PRN

## 2023-08-01 NOTE — Telephone Encounter (Signed)
 Patient has picked up and started wegovy .

## 2023-08-02 ENCOUNTER — Other Ambulatory Visit: Payer: Self-pay | Admitting: Nurse Practitioner

## 2023-08-02 DIAGNOSIS — F411 Generalized anxiety disorder: Secondary | ICD-10-CM

## 2023-08-10 ENCOUNTER — Other Ambulatory Visit: Payer: Self-pay | Admitting: Nurse Practitioner

## 2023-08-10 DIAGNOSIS — F411 Generalized anxiety disorder: Secondary | ICD-10-CM

## 2023-08-10 DIAGNOSIS — M6289 Other specified disorders of muscle: Secondary | ICD-10-CM

## 2023-08-12 ENCOUNTER — Encounter: Payer: Self-pay | Admitting: Nurse Practitioner

## 2023-08-12 DIAGNOSIS — M79671 Pain in right foot: Secondary | ICD-10-CM

## 2023-08-12 NOTE — Telephone Encounter (Signed)
 I do not see a referral for podiatrist placed

## 2023-08-22 ENCOUNTER — Encounter: Payer: Self-pay | Admitting: Nurse Practitioner

## 2023-08-22 MED ORDER — WEGOVY 0.5 MG/0.5ML ~~LOC~~ SOAJ: 0.5000 mg | SUBCUTANEOUS | 0 refills | Status: DC

## 2023-08-31 ENCOUNTER — Ambulatory Visit (INDEPENDENT_AMBULATORY_CARE_PROVIDER_SITE_OTHER)

## 2023-08-31 ENCOUNTER — Ambulatory Visit: Payer: Self-pay | Admitting: Podiatry

## 2023-08-31 VITALS — Ht 64.0 in | Wt 209.0 lb

## 2023-08-31 DIAGNOSIS — M7752 Other enthesopathy of left foot: Secondary | ICD-10-CM | POA: Diagnosis not present

## 2023-08-31 DIAGNOSIS — M7751 Other enthesopathy of right foot: Secondary | ICD-10-CM

## 2023-08-31 DIAGNOSIS — S92252S Displaced fracture of navicular [scaphoid] of left foot, sequela: Secondary | ICD-10-CM | POA: Diagnosis not present

## 2023-08-31 DIAGNOSIS — S86311A Strain of muscle(s) and tendon(s) of peroneal muscle group at lower leg level, right leg, initial encounter: Secondary | ICD-10-CM

## 2023-08-31 NOTE — Patient Instructions (Addendum)
 VISIT SUMMARY: Today, you were seen for bilateral ankle pain and instability that you have been experiencing for the past three years. We discussed your history of ballet dancing and how it may have contributed to your ankle issues. You described the pain as throbbing and deep, with specific symptoms on both the left and right sides. We reviewed your current management strategies and discussed further diagnostic and treatment options.  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 We will order an MRI to get a better look at the cartilage and any potential bone fragments. Depending on the MRI results, we may consider a steroid injection to reduce inflammation or even surgery to remove any problematic bone fragments. In the meantime, you should use Voltaren gel for pain and continue taking anti-inflammatory medications as needed.  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 We will order an MRI to check for any tendon or ligament tears. You will also receive a lace-up ankle brace for support and should start home physical therapy exercises to improve ankle stability.  -SCIATICA: Sciatica is a condition where pain radiates along the path of the sciatic nerve, which runs down one or both legs from the lower back. This may be contributing to your ankle symptoms. We will continue to monitor this condition.  -GENERAL HEALTH MAINTENANCE: It is important to continue physical therapy and wear supportive footwear to prevent further injury. These measures will help manage your symptoms and improve your overall joint health.  INSTRUCTIONS: Please schedule an MRI for both your left and right ankles as soon as possible. Follow up with us   after the MRI results are available to discuss the next steps. Continue using Voltaren gel and anti-inflammatory medications as needed for pain. Start using the lace-up ankle brace for your right ankle and begin home physical therapy exercises for ankle stability. If you have any questions or concerns, please do not hesitate to contact our office.           Call [facility] to schedule your MRI at the below locations.  Please allow at least 1 business day after your visit to process the referral.  It may take longer depending on approval from insurance.  Please let me know if you have issues or problems scheduling the MRI   [facility] [phone] [address] [place] [address] [place], [place] [address]  [facility] [phone] [place], [place] [address]            Contains text generated by Abridge.                                 Contains text generated by Abridge.     Chronic Ankle Instability & Rehab Chronic Ankle Instability Chronic ankle instability is a condition that makes the ankle weak and more likely to give way. The condition is common among athletes, especially those with prior ankle ligament injury. Ligaments are strong tissues that connectbones to each other. What are the causes?  This condition is caused by multiple ankle sprains that have not healedproperly, leaving the ankle ligaments loose or damaged. What increases the risk? This condition is more likely to develop in people  who participate in sports in which there is a risk of spraining an ankle. These sports include: Cross-country trail running. Basketball. Baseball. Tennis. Football. Soccer. What are the signs or symptoms? Symptoms of this condition include: Rolling your ankle repeatedly. Swelling. Pain. Bruising. Tenderness. Feeling wobbly or unsteady on your foot. Difficulty walking on uneven  surfaces or in the dark. How is this diagnosed? This condition may be diagnosed based on: Your symptoms. Your medical history. A physical exam. Your health care provider will check your balance, strength, and range of motion. He or she will also check your injured ankle against your healthy ankle. Imaging tests, such as: An X-ray. A CT scan. An MRI. An ultrasound. How is this treated? Treatment for this condition may include: Wearing a removable boot, brace, or splint. Wearing supportive shoes or shoe inserts. Applying ice to the ankle to reduce swelling. Taking anti-inflammatory pain medicine. Doing exercises (physical therapy). Not putting any body weight, or putting only limited body weight, on your ankle for several days. Gradually returning to full activity. Surgery to repair damaged ligaments. Usually, surgery is needed only if the condition is severe or if othertreatments do not work. Follow these instructions at home: If you have a boot, brace, or splint: Wear it as told by your health care provider. Remove it only as told by your health care provider. Loosen it if your toes tingle, become numb, or turn cold and blue. Keep it clean. If it is not waterproof: Do not let it get wet. Cover it with a watertight covering when you take a bath or a shower. Ask your health care provider when it is safe to drive with a boot, brace, or splint on your foot. Managing pain, stiffness, and swelling  If directed, put ice on the injured area. If you have a removable boot, brace, or splint, remove it as told by your health care provider. Put ice in a plastic bag. Place a towel between your skin and the bag. Leave the ice on for 20 minutes, 2-3 times a day. Move your toes, foot, and ankle often to reduce stiffness and swelling. Raise (elevate) the injured area above the level of your heart while you are sitting or lying down.  Activity Return to your normal activities as told by your  health care provider. Ask your health care provider what activities are safe for you. Do not put your full body weight on your ankle until your health care provider says that you can. Do not do any activities that make pain or swelling worse. Do exercises as told by your health care provider. General instructions Take over-the-counter and prescription medicines only as told by your health care provider. Wear supportive shoes or inserts as told by your health care provider. Keep all follow-up visits as told by your health care provider. This is important. How is this prevented? Wear supportive footwear that is appropriate for your athletic activity. Avoid athletic activities that cause pain or swelling in your ankle. See your health care provider if you have an ankle sprain that causes pain and swelling for more than 2-4 weeks. Do ankle range-of-motion and strengthening exercises as told by your health care provider before beginning any athletic activity. If you start a new athletic activity, start gradually to build up your strength and flexibility. Contact a health care provider if: Your condition is not getting better after 2-4 weeks of treatment. You cannot put body weight on your ankle without feeling more pain. Summary Chronic ankle  instability is a condition that makes the ankle weak and more likely to give way. This condition is caused by multiple ankle sprains that have not healed properly, leaving the ankle ligaments loose or damaged. Treatment includes wearing a boot, brace, or splint, taking medicines for pain and inflammation, and using ice on the affected area. Follow your health care provider's instructions for caring for your ankle during recovery. Contact your health care provider if your ankle does not get better in 2-4 weeks, or if you cannot put weight on your ankle without feeling more pain. This information is not intended to replace advice given to you by your health care  provider. Make sure you discuss any questions you have with your healthcare provider. Document Revised: 11/22/2017 Document Reviewed: 11/22/2017 Elsevier Patient Education  2022 [employer] Inc.    Ask your health care provider which exercises are safe for you. Do exercises exactly as told by your health care provider and adjust them as directed. It is normal to feel mild stretching, pulling, tightness, or discomfort as you do these exercises. Stop right away if you feel sudden pain or your pain gets worse. Do not begin these exercises until told by your health care provider. Strengthening exercises These exercises build strength and endurance in your ankle. Endurance is theability to use your muscles for a long time, even after they get tired. Ankle eversion Sit on the floor with your legs straight out in front of you. Loop a rubber exercise band around the ball of your left / right foot. The ball of your foot is on the walking surface, right under your toes. Hold the ends of the band in your hands, or secure the band to a stable object. Slowly push your foot outward, away from your other leg (eversion). Hold this position for 10 seconds. Slowly return your foot to the starting position. Repeat 10 times. Complete this exercise 2 times a day. Heel walking  This exercise strengthens the muscles that push the ankle backward to point your toes toward your knee (ankle dorsiflexors). Walk on your heels for 10 ft. Keep your toes as high as possible. Repeat 10 times. Complete this exercise 2  times per day. Toe walking  This exercise strengthens the muscles that push the ankle forward and your toes downward (ankle plantar flexors). Walk on your toes for 10 ft. Keep your heels as high as possible. Repeat 10  times. Complete this exercise 2  times per day. Balance exercises These exercises improve or maintain your balance. Balance is important inimproving ankle stability and preventing falls. Tandem  walking Do this exercise in a hallway or room that is at least 10 ft (3 m) long. Stand with one foot directly in front of the other (tandem). You can use the walls to help you balance if needed, but try not to use them for support. Slowly lift your back foot and place it directly in front of your other foot. Continue to walk in this heel-to-toe way for 10 ft. Repeat 10  times. Complete this exercise 2  times a day. Single leg stand Without shoes, stand near a railing or in a doorway. You may hold on to the railing or door frame as needed for balance. Stand on your left / right foot. Keep your big toe down on the floor and try to keep your arch lifted. If this is too easy, you can try doing it while you do one of these actions: Keep your eyes closed. Stand  on a pillow. Throw a ball against a wall and catch it when it returns. Hold this position for 10 seconds. Repeat 10 times. Complete this exercise 2 times a day. Ankle inversion and ankle eversion This exercise is also called foot rotation with a balance board. It uses a balance board to rotate the foot and ankle inward (inversion) and outward (eversion). Ask your health care provider where you can get a balance board or how you can make one. Stand on a non-carpeted surface near a countertop or wall. Step onto the balance board so your feet are hip width apart. Keep your feet in place, and keep your upper body and hips steady. Using only your feet and ankles to move the board, do the following exercises: Tip the board from side to side as far as you can, alternating between tipping to the left and tipping to the right. Tip the board so it silently taps the floor. Do not let the board forcefully hit the floor. From time to time, pause to hold a steady midway position, with neither the right nor the left sides touching the ground. Tip the board side to side so the board does not hit the floor at all. From time to time, pause to hold a steady  midway position. Repeat 10 times, pausing from time to time to hold a steady position.Complete this exercise 2 times a day. Ankle plantar flexion and ankle dorsiflexion This exercise is also called foot flexion with a balance board. It uses a balance board to push the foot downward and away from the leg (plantar flexion) or upward and toward the leg (dorsiflexion). Ask your health care provider where you can get a balance board or how you can make one. Stand on a non-carpeted surface near a countertop or wall. Step onto the balance board so your feet are hip width apart. Keep your feet in place, and keep your upper body and hips steady. Using only your feet and ankles, do the following exercises: Tip the board forward and backward so the board silently taps the floor. Do not let the board forcefully hit the floor. From time to time, pause to hold a steady position midway between touching the floor in front and touching the floor in back. Tip the board forward and backward so the board does not hit the floor at all. From time to time, pause to hold a steady position. Repeat 10 times, pausing from time to time to hold a steady position.Complete this exercise 2 times a day. This information is not intended to replace advice given to you by your health care provider. Make sure you discuss any questions you have with your healthcare provider. Document Revised: 06/01/2018 Document Reviewed: 11/22/2017 Elsevier Patient Education  2022 [employer].

## 2023-09-01 ENCOUNTER — Ambulatory Visit
Admission: RE | Admit: 2023-09-01 | Discharge: 2023-09-01 | Disposition: A | Discharge status: Home / Self Care | Source: Ambulatory Visit | Attending: Nurse Practitioner | Admitting: Nurse Practitioner

## 2023-09-01 ENCOUNTER — Encounter: Payer: Self-pay | Admitting: Podiatry

## 2023-09-01 DIAGNOSIS — Z1231 Encounter for screening mammogram for malignant neoplasm of breast: Secondary | ICD-10-CM

## 2023-09-01 NOTE — Progress Notes (Signed)
 Subjective:  Patient ID: [name], female    DOB: [birth year],  MRN: [id number]  Chief Complaint  Patient presents with   Foot Pain    Rm 9 Patient is here for bilateral foot pain. Patient states pain is located on the top of the left foot and the lateral side of the right foot. Pain has been present for the last several months in both feet. Patient states a shooting pain is felt near the right ankle when pressure is applied.    Discussed the use of AI scribe software for clinical note transcription with the patient, who gave verbal consent to proceed.  History of Present Illness [name] is a 53 year old female who presents with bilateral ankle pain and instability.  She has experienced bilateral ankle pain for the past three years, with the left side initially more problematic. The pain is described as 'throbbing' and 'deep', worsening by the end of the day. Despite using ibuprofen and hot/cold therapy, the pain persists.  Her history of ballet dancing for fifteen years may have contributed to her ankle issues. She has had multiple instances of ankle rolling but no fractures or need for casting. About a year ago, she had a severe episode where her foot 'locked', causing excruciating pain and difficulty in movement and weight-bearing.  On the right side, she experiences ankle 'locking' and movement difficulty, especially when driving. She describes a 'jolt' of pain when touching certain spots on her leg, similar to an 'electric shock'. The pain sometimes radiates up the leg, with tenderness over the peroneal tendons and stiffness after inactivity.  On the left side, she reports deep pain in the talonavicular joint area, described as a 'funny bone' sensation. Even light pressure, such as a sheet on her foot, is uncomfortable, with occasional swelling noted.  She manages symptoms with ibuprofen, Tylenol , and occasionally Flexeril  5 mg for muscle spasms, avoiding regular pain  medication due to concerns about constipation, a problem her grandmother also experienced.  She has a history of low back issues and sciatica, primarily on the right side, which may contribute to her symptoms. No shooting pain down the leg when tapping on the back, but there is tenderness in certain areas.      Objective:    Physical Exam VASCULAR: DP and PT pulse palpable. Foot is warm and well-perfused. Capillary fill time is brisk. DERMATOLOGIC: Normal skin turgor, texture, and temperature. No open lesions, rashes, or ulcerations. NEUROLOGIC: Normal sensation to light touch and pressure. No paresthesias. ORTHOPEDIC: Pain and edema over the dorsal talonavicular joint on the left foot. Pain, swelling, and edema around the lateral ankle ligaments and peroneal tendons on the right foot. Sharp radiating pain on the peroneal tendons proximal to the malleolus on the right foot. No gross instability on the left foot. Smooth pain-free range of motion of all other examined joints. No ecchymosis or bruising. No gross deformity.   No images are attached to the encounter.    Results RADIOLOGY Right ankle x-ray: No abnormalities noted in the area of concern to explain her pain (08/31/2023) Left ankle x-ray: Dorsal avulsion fracture and degenerative changes in the talonavicular joint (08/31/2023)   Assessment:   1. Capsulitis of metatarsophalangeal (MTP) joints of both feet      Plan:  Patient was evaluated and treated and all questions answered.  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 Pain is deep and throbbing, exacerbated by pressure and certain movements. X-ray shows degenerative changes and avulsion fracture. Differential includes arthritis and possible bone fragment irritation. - Order MRI of left ankle to assess cartilage and potential  bone fragment issues. - Consider steroid injection for inflammation if MRI indicates. - Discuss potential surgical removal of bone fragment if indicated by MRI. - Recommend use of Voltaren gel for pain management. - Advise continuation of anti-inflammatory medications as needed.  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 Symptoms include locking, pain, and tenderness over peroneal tendons. X-ray shows no bony abnormalities. Differential includes tendon tear and ligamentous instability. - Order MRI of right ankle to evaluate for tendon or ligament tears. - Dispense lace-up ankle brace for support. - Recommend home physical therapy for ankle stability exercises.  Sciatica Chronic right-sided sciatica potentially contributing to ankle symptoms. Symptoms include radiating pain and tenderness.  General Health Maintenance Emphasized the importance of physical therapy and supportive footwear to prevent further injury.      Return in about 2 months (around 11/01/2023) for f/u R ankle and left foot.

## 2023-09-07 ENCOUNTER — Other Ambulatory Visit: Payer: Self-pay | Admitting: Nurse Practitioner

## 2023-09-07 DIAGNOSIS — R928 Other abnormal and inconclusive findings on diagnostic imaging of breast: Secondary | ICD-10-CM

## 2023-09-08 ENCOUNTER — Ambulatory Visit
Admission: RE | Admit: 2023-09-08 | Discharge: 2023-09-08 | Disposition: A | Discharge status: Home / Self Care | Source: Ambulatory Visit | Attending: Podiatry

## 2023-09-08 ENCOUNTER — Other Ambulatory Visit: Payer: Self-pay | Admitting: Nurse Practitioner

## 2023-09-08 DIAGNOSIS — F411 Generalized anxiety disorder: Secondary | ICD-10-CM

## 2023-09-08 DIAGNOSIS — M7672 Peroneal tendinitis, left leg: Secondary | ICD-10-CM | POA: Diagnosis not present

## 2023-09-08 DIAGNOSIS — M6289 Other specified disorders of muscle: Secondary | ICD-10-CM

## 2023-09-08 DIAGNOSIS — S86311A Strain of muscle(s) and tendon(s) of peroneal muscle group at lower leg level, right leg, initial encounter: Secondary | ICD-10-CM

## 2023-09-08 DIAGNOSIS — S92252S Displaced fracture of navicular [scaphoid] of left foot, sequela: Secondary | ICD-10-CM

## 2023-09-08 DIAGNOSIS — M7671 Peroneal tendinitis, right leg: Secondary | ICD-10-CM | POA: Diagnosis not present

## 2023-09-15 ENCOUNTER — Other Ambulatory Visit: Payer: Self-pay | Admitting: Nurse Practitioner

## 2023-09-15 ENCOUNTER — Ambulatory Visit
Admission: RE | Admit: 2023-09-15 | Discharge: 2023-09-15 | Disposition: A | Discharge status: Home / Self Care | Source: Ambulatory Visit | Attending: Nurse Practitioner

## 2023-09-15 DIAGNOSIS — R928 Other abnormal and inconclusive findings on diagnostic imaging of breast: Secondary | ICD-10-CM

## 2023-09-15 DIAGNOSIS — N641 Fat necrosis of breast: Secondary | ICD-10-CM

## 2023-09-15 DIAGNOSIS — N6322 Unspecified lump in the left breast, upper inner quadrant: Secondary | ICD-10-CM | POA: Diagnosis not present

## 2023-09-18 ENCOUNTER — Other Ambulatory Visit: Payer: Self-pay | Admitting: Nurse Practitioner

## 2023-09-19 ENCOUNTER — Ambulatory Visit: Payer: Self-pay | Admitting: Podiatry

## 2023-09-20 ENCOUNTER — Emergency Department (HOSPITAL_BASED_OUTPATIENT_CLINIC_OR_DEPARTMENT_OTHER)
Admission: EM | Admit: 2023-09-20 | Discharge: 2023-09-20 | Disposition: A | Discharge status: Home / Self Care | Attending: Emergency Medicine | Admitting: Emergency Medicine

## 2023-09-20 ENCOUNTER — Other Ambulatory Visit: Payer: Self-pay | Admitting: Nurse Practitioner

## 2023-09-20 ENCOUNTER — Other Ambulatory Visit: Payer: Self-pay

## 2023-09-20 ENCOUNTER — Emergency Department (HOSPITAL_BASED_OUTPATIENT_CLINIC_OR_DEPARTMENT_OTHER)

## 2023-09-20 DIAGNOSIS — J45909 Unspecified asthma, uncomplicated: Secondary | ICD-10-CM | POA: Diagnosis not present

## 2023-09-20 DIAGNOSIS — R9431 Abnormal electrocardiogram [ECG] [EKG]: Secondary | ICD-10-CM | POA: Diagnosis not present

## 2023-09-20 DIAGNOSIS — R1011 Right upper quadrant pain: Secondary | ICD-10-CM | POA: Diagnosis not present

## 2023-09-20 DIAGNOSIS — Z87891 Personal history of nicotine dependence: Secondary | ICD-10-CM | POA: Insufficient documentation

## 2023-09-20 DIAGNOSIS — R1013 Epigastric pain: Secondary | ICD-10-CM | POA: Diagnosis not present

## 2023-09-20 DIAGNOSIS — K7689 Other specified diseases of liver: Secondary | ICD-10-CM | POA: Diagnosis not present

## 2023-09-20 DIAGNOSIS — K2900 Acute gastritis without bleeding: Secondary | ICD-10-CM | POA: Insufficient documentation

## 2023-09-20 DIAGNOSIS — K760 Fatty (change of) liver, not elsewhere classified: Secondary | ICD-10-CM | POA: Diagnosis not present

## 2023-09-20 LAB — CBC WITH DIFFERENTIAL/PLATELET
Abs Immature Granulocytes: 0.09 K/uL — ABNORMAL HIGH (ref 0.00–0.07)
Basophils Absolute: 0.1 K/uL (ref 0.0–0.1)
Basophils Relative: 0 %
Eosinophils Absolute: 0 K/uL (ref 0.0–0.5)
Eosinophils Relative: 0 %
HCT: 45.7 % (ref 36.0–46.0)
Hemoglobin: 15.6 g/dL — ABNORMAL HIGH (ref 12.0–15.0)
Immature Granulocytes: 1 %
Lymphocytes Relative: 12 %
Lymphs Abs: 1.7 K/uL (ref 0.7–4.0)
MCH: 30.3 pg (ref 26.0–34.0)
MCHC: 34.1 g/dL (ref 30.0–36.0)
MCV: 88.7 fL (ref 80.0–100.0)
Monocytes Absolute: 0.8 K/uL (ref 0.1–1.0)
Monocytes Relative: 6 %
Neutro Abs: 11.4 K/uL — ABNORMAL HIGH (ref 1.7–7.7)
Neutrophils Relative %: 81 %
Platelets: 343 K/uL (ref 150–400)
RBC: 5.15 MIL/uL — ABNORMAL HIGH (ref 3.87–5.11)
RDW: 12.4 % (ref 11.5–15.5)
WBC: 14 K/uL — ABNORMAL HIGH (ref 4.0–10.5)
nRBC: 0 % (ref 0.0–0.2)

## 2023-09-20 LAB — COMPREHENSIVE METABOLIC PANEL WITH GFR
ALT: 44 U/L (ref 0–44)
AST: 34 U/L (ref 15–41)
Albumin: 4.9 g/dL (ref 3.5–5.0)
Alkaline Phosphatase: 86 U/L (ref 38–126)
Anion gap: 22 — ABNORMAL HIGH (ref 5–15)
BUN: 17 mg/dL (ref 6–20)
CO2: 17 mmol/L — ABNORMAL LOW (ref 22–32)
Calcium: 10.4 mg/dL — ABNORMAL HIGH (ref 8.9–10.3)
Chloride: 101 mmol/L (ref 98–111)
Creatinine, Ser: 0.83 mg/dL (ref 0.44–1.00)
GFR, Estimated: >60 mL/min (ref >60)
Glucose, Bld: 147 mg/dL — ABNORMAL HIGH (ref 70–99)
Potassium: 3.9 mmol/L (ref 3.5–5.1)
Sodium: 140 mmol/L (ref 135–145)
Total Bilirubin: 0.8 mg/dL (ref 0.0–1.2)
Total Protein: 8.9 g/dL — ABNORMAL HIGH (ref 6.5–8.1)

## 2023-09-20 LAB — LIPASE, BLOOD: Lipase: 16 U/L (ref 11–51)

## 2023-09-20 LAB — TROPONIN T, HIGH SENSITIVITY
Troponin T High Sensitivity: <15 ng/L (ref <19)
Troponin T High Sensitivity: <15 ng/L (ref <19)

## 2023-09-20 MED ORDER — ALUM & MAG HYDROXIDE-SIMETH 200-200-20 MG/5ML PO SUSP
15.0000 mL | Freq: Once | ORAL | Status: AC
  Administered 2023-09-20: 15 mL via ORAL
  Filled 2023-09-20: qty 30

## 2023-09-20 MED ORDER — ONDANSETRON HCL 4 MG/2ML IJ SOLN
4.0000 mg | Freq: Once | INTRAMUSCULAR | Status: AC
  Administered 2023-09-20: 4 mg via INTRAVENOUS
  Filled 2023-09-20: qty 2

## 2023-09-20 MED ORDER — DICYCLOMINE HCL 20 MG PO TABS: 20.0000 mg | ORAL_TABLET | Freq: Two times a day (BID) | ORAL | 0 refills | Status: DC

## 2023-09-20 MED ORDER — DICYCLOMINE HCL 10 MG PO CAPS
10.0000 mg | ORAL_CAPSULE | Freq: Once | ORAL | Status: AC
  Administered 2023-09-20: 10 mg via ORAL
  Filled 2023-09-20: qty 1

## 2023-09-20 MED ORDER — ONDANSETRON HCL 4 MG PO TABS: 4.0000 mg | ORAL_TABLET | Freq: Four times a day (QID) | ORAL | 0 refills | Status: AC

## 2023-09-20 MED ORDER — OMEPRAZOLE 20 MG PO CPDR
20.0000 mg | DELAYED_RELEASE_CAPSULE | Freq: Every day | ORAL | 0 refills | Status: DC
Start: 2023-09-20 — End: 2024-01-04

## 2023-09-20 MED ORDER — MORPHINE SULFATE (PF) 4 MG/ML IV SOLN
4.0000 mg | Freq: Once | INTRAVENOUS | Status: AC
  Administered 2023-09-20: 4 mg via INTRAVENOUS
  Filled 2023-09-20: qty 1

## 2023-09-20 MED ORDER — IOHEXOL 300 MG/ML  SOLN
100.0000 mL | Freq: Once | INTRAMUSCULAR | Status: AC | PRN
  Administered 2023-09-20: 85 mL via INTRAVENOUS

## 2023-09-20 MED ORDER — PANTOPRAZOLE SODIUM 40 MG IV SOLR
40.0000 mg | Freq: Once | INTRAVENOUS | Status: AC
  Administered 2023-09-20: 40 mg via INTRAVENOUS
  Filled 2023-09-20: qty 10

## 2023-09-20 NOTE — Discharge Instructions (Addendum)
 While you were in the emergency room, you had blood work done that was normal.  Your CT scan of your abdomen was overall normal, you do have inflammation of your stomach.  I sent your prescription for some Zofran  which is a nausea medicine.  You may take this every 8 hours as needed for nausea.  I have sent you some omeprazole , which you may take each day to help out with stomach irritation.  Have also sent you some Bentyl  for abdominal pain.  Follow-up with your primary care doctor to discuss your current medications.  Return to the emergency room if you develop worsening abdominal pain, inability to eat or drink.
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
## 2023-09-20 NOTE — ED Triage Notes (Signed)
 Patient states vomiting since last night with epigastric abd pain. States usually gets nauseous after taking wegovy  but this feels different.

## 2023-09-21 ENCOUNTER — Encounter: Payer: Self-pay | Admitting: Nurse Practitioner

## 2023-09-21 DIAGNOSIS — E119 Type 2 diabetes mellitus without complications: Secondary | ICD-10-CM

## 2023-09-23 ENCOUNTER — Other Ambulatory Visit: Payer: Self-pay | Admitting: Nurse Practitioner

## 2023-09-28 MED ORDER — BLOOD GLUCOSE TEST VI STRP: 1.0000 | ORAL_STRIP | Freq: Every day | 1 refills | Status: AC

## 2023-09-28 MED ORDER — LANCET DEVICE MISC: 1.0000 | Freq: Every day | 0 refills | Status: AC

## 2023-09-28 MED ORDER — LANCETS MISC. MISC: 1.0000 | Freq: Every day | 1 refills | Status: AC

## 2023-09-28 MED ORDER — BLOOD GLUCOSE MONITORING SUPPL DEVI: 1.0000 | Freq: Every day | 0 refills | Status: AC

## 2023-10-01 ENCOUNTER — Encounter (HOSPITAL_BASED_OUTPATIENT_CLINIC_OR_DEPARTMENT_OTHER): Payer: Self-pay

## 2023-10-01 ENCOUNTER — Emergency Department (HOSPITAL_BASED_OUTPATIENT_CLINIC_OR_DEPARTMENT_OTHER)

## 2023-10-01 ENCOUNTER — Emergency Department (HOSPITAL_BASED_OUTPATIENT_CLINIC_OR_DEPARTMENT_OTHER)
Admission: EM | Admit: 2023-10-01 | Discharge: 2023-10-01 | Disposition: A | Discharge status: Home / Self Care | Attending: Emergency Medicine | Admitting: Emergency Medicine

## 2023-10-01 ENCOUNTER — Other Ambulatory Visit: Payer: Self-pay

## 2023-10-01 DIAGNOSIS — R1084 Generalized abdominal pain: Secondary | ICD-10-CM | POA: Diagnosis not present

## 2023-10-01 DIAGNOSIS — R112 Nausea with vomiting, unspecified: Secondary | ICD-10-CM | POA: Diagnosis not present

## 2023-10-01 DIAGNOSIS — R109 Unspecified abdominal pain: Secondary | ICD-10-CM | POA: Diagnosis not present

## 2023-10-01 DIAGNOSIS — K7689 Other specified diseases of liver: Secondary | ICD-10-CM | POA: Diagnosis not present

## 2023-10-01 LAB — CBC
HCT: 49 % — ABNORMAL HIGH (ref 36.0–46.0)
Hemoglobin: 16.2 g/dL — ABNORMAL HIGH (ref 12.0–15.0)
MCH: 30.3 pg (ref 26.0–34.0)
MCHC: 33.1 g/dL (ref 30.0–36.0)
MCV: 91.6 fL (ref 80.0–100.0)
Platelets: 308 K/uL (ref 150–400)
RBC: 5.35 MIL/uL — ABNORMAL HIGH (ref 3.87–5.11)
RDW: 12.6 % (ref 11.5–15.5)
WBC: 14 K/uL — ABNORMAL HIGH (ref 4.0–10.5)
nRBC: 0 % (ref 0.0–0.2)

## 2023-10-01 LAB — COMPREHENSIVE METABOLIC PANEL WITH GFR
ALT: 73 U/L — ABNORMAL HIGH (ref 0–44)
AST: 40 U/L (ref 15–41)
Albumin: 4.6 g/dL (ref 3.5–5.0)
Alkaline Phosphatase: 93 U/L (ref 38–126)
Anion gap: 15 (ref 5–15)
BUN: 17 mg/dL (ref 6–20)
CO2: 23 mmol/L (ref 22–32)
Calcium: 9.9 mg/dL (ref 8.9–10.3)
Chloride: 102 mmol/L (ref 98–111)
Creatinine, Ser: 0.9 mg/dL (ref 0.44–1.00)
GFR, Estimated: >60 mL/min (ref >60)
Glucose, Bld: 157 mg/dL — ABNORMAL HIGH (ref 70–99)
Potassium: 3.6 mmol/L (ref 3.5–5.1)
Sodium: 139 mmol/L (ref 135–145)
Total Bilirubin: 0.6 mg/dL (ref 0.0–1.2)
Total Protein: 8.4 g/dL — ABNORMAL HIGH (ref 6.5–8.1)

## 2023-10-01 LAB — URINALYSIS, ROUTINE W REFLEX MICROSCOPIC
Bacteria, UA: NONE SEEN
Bilirubin Urine: NEGATIVE
Glucose, UA: NEGATIVE mg/dL
Ketones, ur: 15 mg/dL — AB
Leukocytes,Ua: NEGATIVE
Nitrite: NEGATIVE
Protein, ur: NEGATIVE mg/dL
Specific Gravity, Urine: >1.046 — ABNORMAL HIGH (ref 1.005–1.030)
pH: 6 (ref 5.0–8.0)

## 2023-10-01 LAB — PREGNANCY, URINE: Preg Test, Ur: NEGATIVE

## 2023-10-01 LAB — LIPASE, BLOOD: Lipase: 18 U/L (ref 11–51)

## 2023-10-01 MED ORDER — DICYCLOMINE HCL 20 MG PO TABS: 20.0000 mg | ORAL_TABLET | Freq: Two times a day (BID) | ORAL | 0 refills | Status: AC

## 2023-10-01 MED ORDER — MORPHINE SULFATE (PF) 4 MG/ML IV SOLN
4.0000 mg | Freq: Once | INTRAVENOUS | Status: AC
  Administered 2023-10-01: 4 mg via INTRAVENOUS
  Filled 2023-10-01: qty 1

## 2023-10-01 MED ORDER — IOHEXOL 300 MG/ML  SOLN
100.0000 mL | Freq: Once | INTRAMUSCULAR | Status: AC | PRN
  Administered 2023-10-01: 100 mL via INTRAVENOUS

## 2023-10-01 MED ORDER — FENTANYL CITRATE PF 50 MCG/ML IJ SOSY
50.0000 ug | PREFILLED_SYRINGE | Freq: Once | INTRAMUSCULAR | Status: AC
  Administered 2023-10-01: 50 ug via INTRAVENOUS
  Filled 2023-10-01: qty 1

## 2023-10-01 MED ORDER — ONDANSETRON HCL 4 MG/2ML IJ SOLN
4.0000 mg | Freq: Once | INTRAMUSCULAR | Status: AC
  Administered 2023-10-01: 4 mg via INTRAVENOUS
  Filled 2023-10-01: qty 2

## 2023-10-01 MED ORDER — DICYCLOMINE HCL 10 MG PO CAPS
10.0000 mg | ORAL_CAPSULE | Freq: Once | ORAL | Status: AC
  Administered 2023-10-01: 10 mg via ORAL
  Filled 2023-10-01: qty 1

## 2023-10-01 MED ORDER — SODIUM CHLORIDE 0.9 % IV BOLUS
1000.0000 mL | Freq: Once | INTRAVENOUS | Status: AC
  Administered 2023-10-01: 1000 mL via INTRAVENOUS

## 2023-10-01 NOTE — ED Notes (Signed)
 Please note that the initial urine sample was inadequate. A new sample will need to be collected and sent for analysis.

## 2023-10-01 NOTE — ED Triage Notes (Signed)
 Patient comes in with severe abdominal pain starting at 0100. She reports nausea and vomiting. She reports constipation for 1 week. She is passing gas. No history of abdominal surgery or issues per patient.

## 2023-10-01 NOTE — Discharge Instructions (Addendum)
 Recheck with your primary care provider. Decrease laxative use as you do not have a large stool burden today. Discussed possible occasion change with your primary care if Wegovy  is causing these problems.  Call GI to schedule a follow up appointment.

## 2023-10-01 NOTE — ED Notes (Signed)
 Patient returned from CT

## 2023-10-01 NOTE — ED Provider Notes (Signed)
 [place] EMERGENCY DEPARTMENT AT [facility] Provider Note   CSN: [id number] Arrival date & time: 10/01/23  1045     Patient presents with: Abdominal Pain   [name] is a 53 y.o. female.   53yo female here for severe lower abdominal pain that began at 1am this morning. It is described as a constant dull ache combined with sharp throbbing that has been persistent since the time of onset with worsening severity. Of note, the patient recently increased her weekly Wegovy  dose 6 days ago. Since, she has not been able to have a bowel movement. She states that since this morning she has also been unable to pass gas. She has also had associated nausea, vomiting, subjective fevers, and sweating beginning this morning. She has not eaten or drank anything since yesterday, though she tolerated her last meal well. While in the ED, she passed a small piece of stool and noticed bright red blood when wiping. Reports sitting on the toilet at home for 4 hours today unable to have a bowel movement.   Seen here 1 week ago with epigastric pain, different from today's presentation, negative work up that day.  Patient has taken milk of magnesia as well as Colace without improvement of her symptoms.  Also taking Zofran  regularly for nausea while she has been on Wegovy .  States that she used to have daily bowel movements, since starting the Wegovy  has decreased to twice weekly. Prior abdominal surgeries include tubaligation.        Prior to Admission medications   Medication Sig Start Date End Date Taking? Authorizing Provider  dicyclomine  (BENTYL ) 20 MG tablet Take 1 tablet (20 mg total) by mouth 2 (two) times daily. 10/01/23  Yes Beverley Leita LABOR, PA-C  albuterol  (VENTOLIN  HFA) 108 (90 Base) MCG/ACT inhaler Inhale 2 puffs into the lungs every 6 (six) hours as needed for wheezing or shortness of breath. 02/11/22   Wendee Lynwood HERO, NP  Blood Glucose Monitoring Suppl DEVI 1 each by Does not apply route  daily. May substitute to any manufacturer covered by patient's insurance. 09/28/23   Wendee Lynwood HERO, NP  cyclobenzaprine  (FLEXERIL ) 5 MG tablet TAKE 1 TABLET BY MOUTH 2 TIMES DAILY AS NEEDED FOR MUSCLE SPASMS. 09/08/23   Wendee Lynwood HERO, NP  famotidine  (PEPCID ) 20 MG tablet TAKE 1 TAB BY MOUTH DAILY AS NEEDED FOR HEARTBURN OR INDIGESTION. 06/16/23   Wendee Lynwood HERO, NP  Glucose Blood (BLOOD GLUCOSE TEST STRIPS) STRP 1 each by In Vitro route daily. May substitute to any manufacturer covered by patient's insurance. 09/28/23   Wendee Lynwood HERO, NP  hydrOXYzine  (VISTARIL ) 25 MG capsule TAKE 1 CAPSULE (25 MG TOTAL) BY MOUTH TWICE A DAY AS NEEDED 07/08/23   Wendee Lynwood HERO, NP  Lancet Device MISC 1 each by Does not apply route daily. May substitute to any manufacturer covered by patient's insurance. 09/28/23 10/28/23  Wendee Lynwood HERO, NP  Lancets Misc. MISC 1 each by Does not apply route daily. May substitute to any manufacturer covered by patient's insurance. 09/28/23   Wendee Lynwood HERO, NP  LORazepam  (ATIVAN ) 0.5 MG tablet TAKE 1 TABLET BY MOUTH TWICE A DAY AS NEEDED FOR ANXIETY. NEEDS OFFICE VISIT FOR FURTHER REFILLS 09/08/23   Wendee Lynwood HERO, NP  mupirocin  ointment (BACTROBAN ) 2 % APPLY TOPICALLY DAILY AS NEEDED 05/06/23   Bedsole, Amy E, MD  omeprazole  (PRILOSEC) 20 MG capsule Take 1 capsule (20 mg total) by mouth daily. 09/20/23 10/20/23  Mannie Fairy DASEN,  DO  ondansetron  (ZOFRAN ) 4 MG tablet Take 1 tablet (4 mg total) by mouth every 6 (six) hours. 09/20/23   Mannie Pac T, DO  ondansetron  (ZOFRAN -ODT) 4 MG disintegrating tablet TAKE 1 TABLET BY MOUTH EVERY 8 HOURS AS NEEDED 09/23/23   Wendee Lynwood HERO, NP  Semaglutide -Weight Management (WEGOVY ) 1 MG/0.5ML SOAJ Inject 1 mg into the skin once a week. 09/19/23   Wendee Lynwood HERO, NP  triamcinolone  cream (KENALOG ) 0.1 % Apply 1 Application topically 2 (two) times daily. 10/12/21   Velma Raisin, MD    Allergies: Penicillins and Sulfa antibiotics    Review of Systems Negative  except as per HPI Updated Vital Signs BP (!) 138/95   Pulse 86   Temp 98.5 F (36.9 C) (Oral)   Resp 16   SpO2 100%   Physical Exam Vitals and nursing note reviewed.  Constitutional:      General: She is not in acute distress.    Appearance: She is well-developed. She is not diaphoretic.  HENT:     Head: Normocephalic and atraumatic.  Cardiovascular:     Rate and Rhythm: Normal rate and regular rhythm.     Heart sounds: Normal heart sounds.  Pulmonary:     Effort: Pulmonary effort is normal.     Breath sounds: Normal breath sounds.  Abdominal:     General: Bowel sounds are decreased.     Palpations: Abdomen is soft.     Tenderness: There is generalized abdominal tenderness.  Skin:    General: Skin is warm and dry.     Findings: No erythema or rash.  Neurological:     Mental Status: She is alert and oriented to person, place, and time.  Psychiatric:        Behavior: Behavior normal.     (all labs ordered are listed, but only abnormal results are displayed) Labs Reviewed  COMPREHENSIVE METABOLIC PANEL WITH GFR - Abnormal; Notable for the following components:      Result Value   Glucose, Bld 157 (*)    Total Protein 8.4 (*)    ALT 73 (*)    All other components within normal limits  CBC - Abnormal; Notable for the following components:   WBC 14.0 (*)    RBC 5.35 (*)    Hemoglobin 16.2 (*)    HCT 49.0 (*)    All other components within normal limits  URINALYSIS, ROUTINE W REFLEX MICROSCOPIC - Abnormal; Notable for the following components:   Specific Gravity, Urine >1.046 (*)    Hgb urine dipstick SMALL (*)    Ketones, ur 15 (*)    All other components within normal limits  LIPASE, BLOOD  PREGNANCY, URINE    EKG: EKG Interpretation Date/Time:  Saturday October 01 2023 11:01:28 EDT Ventricular Rate:  78 PR Interval:  143 QRS Duration:  90 QT Interval:  376 QTC Calculation: 429 R Axis:   54  Text Interpretation: Sinus rhythm Confirmed by Lenor Hollering  [phone]) on 10/01/2023 11:29:54 AM  Radiology: CT ABDOMEN PELVIS W CONTRAST Result Date: 10/01/2023 CLINICAL DATA:  Severe abdominal pain, nausea and vomiting. EXAM: CT ABDOMEN AND PELVIS WITH CONTRAST TECHNIQUE: Multidetector CT imaging of the abdomen and pelvis was performed using the standard protocol following bolus administration of intravenous contrast. RADIATION DOSE REDUCTION: This exam was performed according to the departmental dose-optimization program which includes automated exposure control, adjustment of the mA and/or kV according to patient size and/or use of iterative reconstruction technique. CONTRAST:  100mL OMNIPAQUE  IOHEXOL   300 MG/ML  SOLN COMPARISON:  09/20/2023. FINDINGS: Lower chest: No acute findings. Heart size normal. No pericardial or pleural effusion. Distal esophagus is grossly unremarkable. Hepatobiliary: Small left hepatic lobe cyst. No specific follow-up necessary. Liver and gallbladder are otherwise unremarkable. No biliary ductal dilatation. Pancreas: Negative. Spleen: Negative. Adrenals/Urinary Tract: Adrenal glands and kidneys are unremarkable. Ureters are decompressed. Bladder is low in volume. Stomach/Bowel: Stomach, small bowel, appendix and colon are unremarkable. Vascular/Lymphatic: Atherosclerotic calcification of the aorta. No pathologically enlarged lymph nodes. Reproductive: Uterus is visualized.  No adnexal mass. Other: No free fluid.  Mesenteries and peritoneum are unremarkable. Musculoskeletal: Degenerative changes in the spine. IMPRESSION: 1. No acute findings. 2.  Aortic atherosclerosis (ICD10-I70.0). Electronically Signed   By: Newell Eke M.D.   On: 10/01/2023 12:41     Procedures   Medications Ordered in the ED  ondansetron  (ZOFRAN ) injection 4 mg (4 mg Intravenous Given 10/01/23 1143)  sodium chloride  0.9 % bolus 1,000 mL (0 mLs Intravenous Stopped 10/01/23 1332)  morphine  (PF) 4 MG/ML injection 4 mg (4 mg Intravenous Given 10/01/23 1142)  iohexol   (OMNIPAQUE ) 300 MG/ML solution 100 mL (100 mLs Intravenous Contrast Given 10/01/23 1213)  dicyclomine  (BENTYL ) capsule 10 mg (10 mg Oral Given 10/01/23 1334)  fentaNYL  (SUBLIMAZE ) injection 50 mcg (50 mcg Intravenous Given 10/01/23 1437)                                    Medical Decision Making Amount and/or Complexity of Data Reviewed Labs: ordered. Radiology: ordered.  Risk Prescription drug management.   This patient presents to the ED for concern of abdominal pain, this involves an extensive number of treatment options, and is a complaint that carries with it a high risk of complications and morbidity.  The differential diagnosis includes SBO, intussusception, mass, colitis, constipation   Co morbidities / Chronic conditions that complicate the patient evaluation  Asthma, anxiety, GERD, ADHD   Additional history obtained:  Additional history obtained from EMR External records from outside source obtained and reviewed including prior CT from ER visit end of last month as well as labs.   Lab Tests:  I Ordered, and personally interpreted labs.  The pertinent results include: CBC with WBC 14,000, unchanged from prior.  CMP without significant findings.  Lipase normal.  Urinalysis with small ketones.  hCG negative.   Imaging Studies ordered:  I ordered imaging studies including CT abdomen pelvis I independently visualized and interpreted imaging which showed no acute process, no significant stool burden. I agree with the radiologist interpretation   Problem List / ED Course / Critical interventions / Medication management  53 year old female presents emergency room with complaint of severe abdominal pain.  Recently increased her Wegovy  dose.  Seen in ER end of last month for same.  On exam, diffuse generalized abdominal pain, diminished bowel sounds.  Labs without significant findings or changes from prior labs.  CT without acute process, no significant stool burden.  Patient  has spent the majority of her ER visit on the commode, notes that she is passing a lot of liquid with some bright red blood.  Commode visualized and does have watery brown stools as well as slight pink tinge to the water.  Hemodynamically stable.  Question if patient is having watery stools as a result of the Colace and milk of magnesia she took prior to arrival, does not have significant stool burden on her CT.  Discussed bright red blood likely secondary to either fissure versus hemorrhoid, will provide referral to GI for follow-up with return to ER precautions.  Prescription for Bentyl . I ordered medication including morphine , Zofran , fentanyl , Bentyl  Reevaluation of the patient after these medicines showed that the patient pain slowly improving I have reviewed the patients home medicines and have made adjustments as needed    Social Determinants of Health:  Has PCP, lives with spouse   Test / Admission - Considered:  Stable for discharge      Final diagnoses:  Generalized abdominal pain    ED Discharge Orders          Ordered    dicyclomine  (BENTYL ) 20 MG tablet  2 times daily        10/01/23 1559               Beverley Leita LABOR, PA-C 10/01/23 1632    Lenor Hollering, MD 10/02/23 [phone]

## 2023-10-02 ENCOUNTER — Emergency Department (HOSPITAL_BASED_OUTPATIENT_CLINIC_OR_DEPARTMENT_OTHER): Admission: EM | Admit: 2023-10-02 | Discharge: 2023-10-02 | Disposition: A | Discharge status: Home / Self Care

## 2023-10-02 ENCOUNTER — Encounter (HOSPITAL_BASED_OUTPATIENT_CLINIC_OR_DEPARTMENT_OTHER): Payer: Self-pay

## 2023-10-02 DIAGNOSIS — R197 Diarrhea, unspecified: Secondary | ICD-10-CM | POA: Insufficient documentation

## 2023-10-02 DIAGNOSIS — R1084 Generalized abdominal pain: Secondary | ICD-10-CM | POA: Diagnosis not present

## 2023-10-02 DIAGNOSIS — R112 Nausea with vomiting, unspecified: Secondary | ICD-10-CM | POA: Insufficient documentation

## 2023-10-02 DIAGNOSIS — R109 Unspecified abdominal pain: Secondary | ICD-10-CM

## 2023-10-02 DIAGNOSIS — R1031 Right lower quadrant pain: Secondary | ICD-10-CM | POA: Diagnosis not present

## 2023-10-02 LAB — COMPREHENSIVE METABOLIC PANEL WITH GFR
ALT: 54 U/L — ABNORMAL HIGH (ref 0–44)
AST: 26 U/L (ref 15–41)
Albumin: 4.7 g/dL (ref 3.5–5.0)
Alkaline Phosphatase: 79 U/L (ref 38–126)
Anion gap: 15 (ref 5–15)
BUN: 12 mg/dL (ref 6–20)
CO2: 20 mmol/L — ABNORMAL LOW (ref 22–32)
Calcium: 10 mg/dL (ref 8.9–10.3)
Chloride: 102 mmol/L (ref 98–111)
Creatinine, Ser: 0.9 mg/dL (ref 0.44–1.00)
GFR, Estimated: >60 mL/min (ref >60)
Glucose, Bld: 164 mg/dL — ABNORMAL HIGH (ref 70–99)
Potassium: 3.7 mmol/L (ref 3.5–5.1)
Sodium: 137 mmol/L (ref 135–145)
Total Bilirubin: 0.8 mg/dL (ref 0.0–1.2)
Total Protein: 8 g/dL (ref 6.5–8.1)

## 2023-10-02 LAB — CBC
HCT: 47.6 % — ABNORMAL HIGH (ref 36.0–46.0)
Hemoglobin: 16.2 g/dL — ABNORMAL HIGH (ref 12.0–15.0)
MCH: 30.8 pg (ref 26.0–34.0)
MCHC: 34 g/dL (ref 30.0–36.0)
MCV: 90.5 fL (ref 80.0–100.0)
Platelets: 313 K/uL (ref 150–400)
RBC: 5.26 MIL/uL — ABNORMAL HIGH (ref 3.87–5.11)
RDW: 12.8 % (ref 11.5–15.5)
WBC: 15.2 K/uL — ABNORMAL HIGH (ref 4.0–10.5)
nRBC: 0 % (ref 0.0–0.2)

## 2023-10-02 LAB — LIPASE, BLOOD: Lipase: 19 U/L (ref 11–51)

## 2023-10-02 MED ORDER — HYDROMORPHONE HCL 1 MG/ML IJ SOLN
0.5000 mg | Freq: Once | INTRAMUSCULAR | Status: AC
  Administered 2023-10-02: 0.5 mg via INTRAVENOUS
  Filled 2023-10-02: qty 1

## 2023-10-02 MED ORDER — ONDANSETRON HCL 4 MG/2ML IJ SOLN
4.0000 mg | Freq: Once | INTRAMUSCULAR | Status: AC
  Administered 2023-10-02: 4 mg via INTRAVENOUS
  Filled 2023-10-02: qty 2

## 2023-10-02 MED ORDER — SODIUM CHLORIDE 0.9 % IV BOLUS
1000.0000 mL | Freq: Once | INTRAVENOUS | Status: AC
  Administered 2023-10-02: 1000 mL via INTRAVENOUS

## 2023-10-02 MED ORDER — SUCRALFATE 1 G PO TABS: 1.0000 g | ORAL_TABLET | Freq: Three times a day (TID) | ORAL | 0 refills | Status: AC

## 2023-10-02 NOTE — Discharge Instructions (Signed)
 You can stop your Bentyl  if it is not helping.  Keep a bland diet and drink lots of fluids for the next day.  You should take your omeprazole  and Carafate  as prescribed.  Follow-up with primary care.  Call them tomorrow to make an appointment for later this week.  Stop your Wegovy .  You should also try to follow-up with GI as recommended yesterday when you were in the ER.  The phone numbers listed below.

## 2023-10-02 NOTE — ED Provider Notes (Signed)
 [place] EMERGENCY DEPARTMENT AT [facility] Provider Note   CSN: [id number] Arrival date & time: 10/02/23  9372     Patient presents with: Abdominal Pain   [name] is a 53 y.o. female.   53 year old female presents for evaluation of abdominal pain.  She was here yesterday for the same thing but states it got worse.  States she was also here today for rectal bleeding but that also got heavier.  States now she is also nauseous and throwing up.  States she has not been able to eat in 2 days.  Recently had an increase in her dose of Wegovy  and thinks this might be related to her symptoms.  States none of the medicine she started taking yesterday have helped.  Denies any other symptoms or concern at this time.   Abdominal Pain Associated symptoms: diarrhea, nausea and vomiting   Associated symptoms: no chest pain, no chills, no cough, no dysuria, no fever, no hematuria, no shortness of breath and no sore throat        Prior to Admission medications   Medication Sig Start Date End Date Taking? Authorizing Provider  sucralfate  (CARAFATE ) 1 g tablet Take 1 tablet (1 g total) by mouth in the morning, at noon, and at bedtime for 14 days. 10/02/23 10/16/23 Yes Kiana Hollar L, DO  albuterol  (VENTOLIN  HFA) 108 (90 Base) MCG/ACT inhaler Inhale 2 puffs into the lungs every 6 (six) hours as needed for wheezing or shortness of breath. 02/11/22   Wendee Lynwood HERO, NP  Blood Glucose Monitoring Suppl DEVI 1 each by Does not apply route daily. May substitute to any manufacturer covered by patient's insurance. 09/28/23   Wendee Lynwood HERO, NP  cyclobenzaprine  (FLEXERIL ) 5 MG tablet TAKE 1 TABLET BY MOUTH 2 TIMES DAILY AS NEEDED FOR MUSCLE SPASMS. 09/08/23   Wendee Lynwood HERO, NP  dicyclomine  (BENTYL ) 20 MG tablet Take 1 tablet (20 mg total) by mouth 2 (two) times daily. 10/01/23   Beverley Leita LABOR, PA-C  famotidine  (PEPCID ) 20 MG tablet TAKE 1 TAB BY MOUTH DAILY AS NEEDED FOR HEARTBURN OR  INDIGESTION. 06/16/23   Wendee Lynwood HERO, NP  Glucose Blood (BLOOD GLUCOSE TEST STRIPS) STRP 1 each by In Vitro route daily. May substitute to any manufacturer covered by patient's insurance. 09/28/23   Wendee Lynwood HERO, NP  hydrOXYzine  (VISTARIL ) 25 MG capsule TAKE 1 CAPSULE (25 MG TOTAL) BY MOUTH TWICE A DAY AS NEEDED 07/08/23   Wendee Lynwood HERO, NP  Lancet Device MISC 1 each by Does not apply route daily. May substitute to any manufacturer covered by patient's insurance. 09/28/23 10/28/23  Wendee Lynwood HERO, NP  Lancets Misc. MISC 1 each by Does not apply route daily. May substitute to any manufacturer covered by patient's insurance. 09/28/23   Wendee Lynwood HERO, NP  LORazepam  (ATIVAN ) 0.5 MG tablet TAKE 1 TABLET BY MOUTH TWICE A DAY AS NEEDED FOR ANXIETY. NEEDS OFFICE VISIT FOR FURTHER REFILLS 09/08/23   Wendee Lynwood HERO, NP  mupirocin  ointment (BACTROBAN ) 2 % APPLY TOPICALLY DAILY AS NEEDED 05/06/23   Bedsole, Amy E, MD  omeprazole  (PRILOSEC) 20 MG capsule Take 1 capsule (20 mg total) by mouth daily. 09/20/23 10/20/23  Mannie Pac T, DO  ondansetron  (ZOFRAN ) 4 MG tablet Take 1 tablet (4 mg total) by mouth every 6 (six) hours. 09/20/23   Mannie Pac DASEN, DO  ondansetron  (ZOFRAN -ODT) 4 MG disintegrating tablet TAKE 1 TABLET BY MOUTH EVERY 8 HOURS AS NEEDED 09/23/23  Wendee Lynwood HERO, NP  Semaglutide -Weight Management (WEGOVY ) 1 MG/0.5ML SOAJ Inject 1 mg into the skin once a week. 09/19/23   Wendee Lynwood HERO, NP  triamcinolone  cream (KENALOG ) 0.1 % Apply 1 Application topically 2 (two) times daily. 10/12/21   Velma Raisin, MD    Allergies: Penicillins and Sulfa antibiotics    Review of Systems  Constitutional:  Negative for chills and fever.  HENT:  Negative for ear pain and sore throat.   Eyes:  Negative for pain and visual disturbance.  Respiratory:  Negative for cough and shortness of breath.   Cardiovascular:  Negative for chest pain and palpitations.  Gastrointestinal:  Positive for abdominal pain, blood in stool,  diarrhea, nausea and vomiting.  Genitourinary:  Negative for dysuria and hematuria.  Musculoskeletal:  Negative for arthralgias and back pain.  Skin:  Negative for color change and rash.  Neurological:  Negative for seizures and syncope.  All other systems reviewed and are negative.   Updated Vital Signs BP 137/83 (BP Location: Right Arm)   Pulse 82   Temp 98.8 F (37.1 C) (Oral)   Resp 18   Ht 5' 4 (1.626 m)   Wt 94.8 kg   SpO2 98%   BMI 35.87 kg/m   Physical Exam Vitals and nursing note reviewed.  Constitutional:      General: She is not in acute distress.    Appearance: She is well-developed. She is obese. She is not ill-appearing.  HENT:     Head: Normocephalic and atraumatic.  Eyes:     Conjunctiva/sclera: Conjunctivae normal.  Cardiovascular:     Rate and Rhythm: Normal rate and regular rhythm.     Heart sounds: No murmur heard. Pulmonary:     Effort: Pulmonary effort is normal. No respiratory distress.     Breath sounds: Normal breath sounds.  Abdominal:     Palpations: Abdomen is soft.     Tenderness: There is generalized abdominal tenderness and tenderness in the right lower quadrant and left lower quadrant.  Musculoskeletal:        General: No swelling.     Cervical back: Neck supple.  Skin:    General: Skin is warm and dry.     Capillary Refill: Capillary refill takes less than 2 seconds.  Neurological:     Mental Status: She is alert.  Psychiatric:        Mood and Affect: Mood normal.     (all labs ordered are listed, but only abnormal results are displayed) Labs Reviewed  COMPREHENSIVE METABOLIC PANEL WITH GFR - Abnormal; Notable for the following components:      Result Value   CO2 20 (*)    Glucose, Bld 164 (*)    ALT 54 (*)    All other components within normal limits  CBC - Abnormal; Notable for the following components:   WBC 15.2 (*)    RBC 5.26 (*)    Hemoglobin 16.2 (*)    HCT 47.6 (*)    All other components within normal limits   LIPASE, BLOOD    EKG: None  Radiology: CT ABDOMEN PELVIS W CONTRAST Result Date: 10/01/2023 CLINICAL DATA:  Severe abdominal pain, nausea and vomiting. EXAM: CT ABDOMEN AND PELVIS WITH CONTRAST TECHNIQUE: Multidetector CT imaging of the abdomen and pelvis was performed using the standard protocol following bolus administration of intravenous contrast. RADIATION DOSE REDUCTION: This exam was performed according to the departmental dose-optimization program which includes automated exposure control, adjustment of the mA and/or kV  according to patient size and/or use of iterative reconstruction technique. CONTRAST:  OMNIPAQUE  IOHEXOL  300 MG/ML  SOLN COMPARISON:  09/20/2023. FINDINGS: Lower chest: No acute findings. Heart size normal. No pericardial or pleural effusion. Distal esophagus is grossly unremarkable. Hepatobiliary: Small left hepatic lobe cyst. No specific follow-up necessary. Liver and gallbladder are otherwise unremarkable. No biliary ductal dilatation. Pancreas: Negative. Spleen: Negative. Adrenals/Urinary Tract: Adrenal glands and kidneys are unremarkable. Ureters are decompressed. Bladder is low in volume. Stomach/Bowel: Stomach, small bowel, appendix and colon are unremarkable. Vascular/Lymphatic: Atherosclerotic calcification of the aorta. No pathologically enlarged lymph nodes. Reproductive: Uterus is visualized.  No adnexal mass. Other: No free fluid.  Mesenteries and peritoneum are unremarkable. Musculoskeletal: Degenerative changes in the spine. IMPRESSION: 1. No acute findings. 2.  Aortic atherosclerosis (ICD10-I70.0). Electronically Signed   By: Newell Eke M.D.   On: 10/01/2023 12:41     Procedures   Medications Ordered in the ED  sodium chloride  0.9 % bolus 1,000 mL (0 mLs Intravenous Stopped 10/02/23 0912)  ondansetron  (ZOFRAN ) injection 4 mg (4 mg Intravenous Given 10/02/23 0722)  HYDROmorphone  (DILAUDID ) injection 0.5 mg (0.5 mg Intravenous Given 10/02/23 0723)   HYDROmorphone  (DILAUDID ) injection 0.5 mg (0.5 mg Intravenous Given 10/02/23 0905)                                    Medical Decision Making Cardiac monitor interpretation: Sinus rhythm, no ectopy  Patient here again for similar symptoms just feeling worse than yesterday.  Vitals are stable, there is no good reason to repeat CT scan she is not having increased pain on exam.  Gave her fluid Zofran  and pain meds and she is feeling much improved.  Able to tolerate p.o.  Lab workup reviewed and unremarkable.  Will give her prescription for Zofran , advised to stop Wegovy  and advised to use Pepcid  and Carafate  daily and discontinue Bentyl  is not helping.  Advise clear liquid diet and return to the ER for new or worsening symptoms.  Feels comfortable plan to be discharged home.  Problems Addressed: Abdominal pain, non-surgical: acute illness or injury Vomiting and diarrhea: acute illness or injury  Amount and/or Complexity of Data Reviewed External Data Reviewed: notes.    Details: ED records from yesterday reviewed and patient was seen here for similar symptoms had negative lab workup and CT scan Labs: ordered. Decision-making details documented in ED Course.    Details: Ordered and reviewed by me and patient with slightly increasing leukocytosis and hemoglobin, likely some dehydration, labs otherwise fairly unremarkable  Risk OTC drugs. Prescription drug management. Parenteral controlled substances. Drug therapy requiring intensive monitoring for toxicity.     Final diagnoses:  Vomiting and diarrhea  Abdominal pain, non-surgical    ED Discharge Orders          Ordered    sucralfate  (CARAFATE ) 1 g tablet  3 times daily        10/02/23 0855               Gennaro Bouchard L, DO 10/02/23 1408

## 2023-10-02 NOTE — ED Notes (Signed)
 Water and orange juice provided to patient for PO fluid challenge.

## 2023-10-02 NOTE — ED Triage Notes (Addendum)
 Pt reports she is here today due to same as yesterday, Pt reports all over abd pain. Pt reports the pain is worse. Pt reports she is having more bloody stools. Pt reports N&V. Pt reports cramping in abd.

## 2023-10-03 ENCOUNTER — Encounter: Payer: Self-pay | Admitting: Nurse Practitioner

## 2023-10-04 ENCOUNTER — Ambulatory Visit: Payer: Self-pay

## 2023-10-04 NOTE — Telephone Encounter (Signed)
 Noted. Agree with disposition of being evaluated

## 2023-10-04 NOTE — Telephone Encounter (Signed)
 FYI Only or Action Required?: FYI only for provider.  Patient was last seen in primary care on 07/27/2023 by Wendee Lynwood HERO, NP.  Called Nurse Triage reporting Abdominal Pain. Vomiting, blood in stool.   Symptoms began several weeks ago.  Interventions attempted: Other: ED2 times.  Symptoms are: gradually worsening.  Triage Disposition: Go to ED Now (Notify PCP)  Patient/caregiver understands and will follow disposition?: Yes                             Copied from CRM [id number]. Topic: Clinical - Red Word Triage >> Oct 04, 2023  8:07 AM Aleatha BROCKS wrote: Red Word that prompted transfer to Nurse Triage: Patient recently been in ER twice and been release recently, having severe  abdominal pain, vomiting, blood in stool, having stomach spasms that feel like contraction but worst and was told possible internal Hemorids bleeding Reason for Disposition  [1] SEVERE pain (e.g., excruciating) AND [2] present > 1 hour  Answer Assessment - Initial Assessment Questions 1. LOCATION: Where does it hurt?      abdomen 2. RADIATION: Does the pain shoot anywhere else? (e.g., chest, back)     no 3. ONSET: When did the pain begin? (e.g., minutes, hours or days ago)      End of July 4. SUDDEN: Gradual or sudden onset?     Sudden  5. PATTERN Does the pain come and go, or is it constant?     Comes and goes -  6. SEVERITY: How bad is the pain?  (e.g., Scale 1-10; mild, moderate, or severe)     10/10 7. RECURRENT SYMPTOM: Have you ever had this type of stomach pain before? If Yes, ask: When was the last time? and What happened that time?      no 8. CAUSE: What do you think is causing the stomach pain? (e.g., gallstones, recent abdominal surgery)     ED thought it was ED 9. RELIEVING/AGGRAVATING FACTORS: What makes it better or worse? (e.g., antacids, bending or twisting motion, bowel movement)     Had a bowel movement and feels a little better 10. OTHER  SYMPTOMS: Do you have any other symptoms? (e.g., back pain, diarrhea, fever, urination pain, vomiting)       Vomiting  Protocols used: Abdominal Pain - Female-A-AH

## 2023-10-06 ENCOUNTER — Other Ambulatory Visit: Payer: Self-pay | Admitting: Nurse Practitioner

## 2023-10-06 DIAGNOSIS — M6289 Other specified disorders of muscle: Secondary | ICD-10-CM

## 2023-10-06 DIAGNOSIS — F411 Generalized anxiety disorder: Secondary | ICD-10-CM

## 2023-10-06 DIAGNOSIS — K219 Gastro-esophageal reflux disease without esophagitis: Secondary | ICD-10-CM
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
## 2023-11-02 ENCOUNTER — Ambulatory Visit: Admitting: Podiatry

## 2023-11-06 ENCOUNTER — Other Ambulatory Visit: Payer: Self-pay | Admitting: Nurse Practitioner

## 2023-11-06 DIAGNOSIS — M6289 Other specified disorders of muscle: Secondary | ICD-10-CM

## 2023-11-06 DIAGNOSIS — F411 Generalized anxiety disorder: Secondary | ICD-10-CM

## 2023-11-25 ENCOUNTER — Encounter: Admitting: Nurse Practitioner

## 2023-12-05 ENCOUNTER — Other Ambulatory Visit: Payer: Self-pay | Admitting: Nurse Practitioner

## 2023-12-05 DIAGNOSIS — E119 Type 2 diabetes mellitus without complications: Secondary | ICD-10-CM

## 2023-12-06 ENCOUNTER — Other Ambulatory Visit: Payer: Self-pay | Admitting: Nurse Practitioner

## 2023-12-06 DIAGNOSIS — M6289 Other specified disorders of muscle: Secondary | ICD-10-CM

## 2023-12-06 DIAGNOSIS — F411 Generalized anxiety disorder: Secondary | ICD-10-CM

## 2023-12-07 ENCOUNTER — Encounter: Payer: Self-pay | Admitting: Podiatry

## 2023-12-07 ENCOUNTER — Ambulatory Visit: Admitting: Podiatry

## 2023-12-07 VITALS — Ht 64.0 in | Wt 209.0 lb

## 2023-12-07 DIAGNOSIS — M7672 Peroneal tendinitis, left leg: Secondary | ICD-10-CM | POA: Diagnosis not present

## 2023-12-07 DIAGNOSIS — M7671 Peroneal tendinitis, right leg: Secondary | ICD-10-CM

## 2023-12-07 MED ORDER — METHYLPREDNISOLONE 4 MG PO TBPK
ORAL_TABLET | ORAL | 0 refills | Status: AC
Start: 2023-12-07 — End: ?

## 2023-12-07 NOTE — Progress Notes (Signed)
 Subjective:  Patient ID: [name], female    DOB: Jan 24, 1971,  MRN: [id number]  Chief Complaint  Patient presents with   Foot Pain    RM 2 Patient is here to f/u on bilateral foot pain. Patient states intermittent pain in right foot is currently using a otc cream for pain ( similar to Voltaren). Patient states continued pain in left foot with no relief. Patient would like to avoid surgery or steroid shots at this time.    Discussed the use of AI scribe software for clinical note transcription with the patient, who gave verbal consent to proceed.  History of Present Illness [name] is a 53 year old female who presents with bilateral foot and ankle pain.  She experiences bilateral foot and ankle pain, with the right foot pain being manageable and intermittent. Over-the-counter pain relief cream similar to Voltaren provides some relief for the right foot. 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 The pain sometimes disrupts her sleep, occurring one or two nights a week, and tends to build up over the week, feeling better in the morning but worsening by midweek.  An MRI conducted on September 08, 2023, revealed significant tenosynovitis and tendinosis of the perineal tendons and the flexor hallucis longus (FHL) in the posterior left ankle joint. The right ankle did not show any tenosynovitis or tendinosis. She tends to favor the left ankle, wearing down sandals unevenly, and describes a popping sensation, especially on uneven surfaces. The pain is more tender at the top of the ankle.  Current medications include the CVS version of Voltaren gel, which provides some relief, but ibuprofen, Tylenol , and naproxen do not alleviate the pain. She uses an ankle brace on the left ankle, which helps when used proactively in the afternoons.       Objective:    Physical Exam VASCULAR:  DP and PT pulse palpable. Foot is warm and well-perfused. Capillary fill time is brisk. DERMATOLOGIC: Normal skin turgor, texture, and temperature. No open lesions, rashes, or ulcerations. NEUROLOGIC: Normal sensation to light touch and pressure. No paresthesias. ORTHOPEDIC: Mild pain of the left perineal tendon. No instability of the right foot. Mild pain of the midfoot with range of motion, midfoot arthritis. No ecchymosis or bruising. No gross deformity.   No images are attached to the encounter.    Results RADIOLOGY Bilateral ankle MRI: Significant tenosynovitis and tendinosis of the peroneal tendons and flexor hallucis longus (FHL) in the left posterior ankle joint; no tenosynovitis or tendinosis in the right ankle (09/08/2023)   Assessment:   1. Peroneal tendinitis of left lower extremity   2. Peroneal tendinitis of right lower extremity      Plan:  Patient was evaluated and treated and all questions answered.  Assessment and Plan Assessment & Plan Left ankle peroneal and flexor hallucis longus tendon tenosynovitis and tendinosis Chronic left ankle pain with significant tenosynovitis and tendinosis of the peroneal tendons and FHL as shown on MRI. Symptoms include burning sensation, tenderness, and occasional popping. Pain exacerbated by uneven surfaces and worsens throughout the week. No tears noted on MRI. Consideration of underlying arthritis as a contributing factor. - Refer to physical therapy for strengthening and stabilization exercises. - Prescribe a one-week course of oral prednisone  to reduce inflammation. - Consider corticosteroid injection if pain persists after a couple of months.  Left midfoot osteoarthritis Mild pain in the left midfoot with range  of motion, suggestive of osteoarthritis. Symptoms managed with over-the-counter pain relief creams and NSAIDs, though effectiveness is limited. Discussed the chronic nature of arthritis and potential need for surgical  intervention if symptoms become unmanageable. - Continue use of over-the-counter pain relief creams and NSAIDs as needed. - Monitor symptoms and consider surgical intervention if pain becomes unmanageable.  Right foot and ankle pain Intermittent and manageable right foot and ankle pain, with no significant findings of tenosynovitis or tendinosis on MRI. Pain managed with over-the-counter pain relief creams. - Continue use of over-the-counter pain relief creams as needed.      Return in about 3 months (around 03/08/2024) for f/u bilateral arthritis, peroneal tendiniits.

## 2023-12-20 ENCOUNTER — Ambulatory Visit

## 2023-12-20 ENCOUNTER — Ambulatory Visit
Admission: RE | Admit: 2023-12-20 | Discharge: 2023-12-20 | Disposition: A | Discharge status: Home / Self Care | Source: Ambulatory Visit | Attending: Nurse Practitioner | Admitting: Nurse Practitioner

## 2023-12-20 DIAGNOSIS — N641 Fat necrosis of breast: Secondary | ICD-10-CM

## 2023-12-22 ENCOUNTER — Ambulatory Visit: Payer: Self-pay | Admitting: Nurse Practitioner

## 2023-12-31 ENCOUNTER — Other Ambulatory Visit: Payer: Self-pay | Admitting: Nurse Practitioner

## 2023-12-31 DIAGNOSIS — K219 Gastro-esophageal reflux disease without esophagitis: Secondary | ICD-10-CM

## 2024-01-03 ENCOUNTER — Encounter: Payer: Self-pay | Admitting: Nurse Practitioner

## 2024-01-04 MED ORDER — OMEPRAZOLE 20 MG PO CPDR: 20.0000 mg | DELAYED_RELEASE_CAPSULE | Freq: Every day | ORAL | 0 refills | Status: DC

## 2024-01-09 ENCOUNTER — Other Ambulatory Visit: Payer: Self-pay | Admitting: Nurse Practitioner

## 2024-01-09 DIAGNOSIS — M6289 Other specified disorders of muscle: Secondary | ICD-10-CM

## 2024-01-09 DIAGNOSIS — F411 Generalized anxiety disorder: Secondary | ICD-10-CM

## 2024-01-09 NOTE — Telephone Encounter (Signed)
 Called and schedule pt for F/U

## 2024-01-09 NOTE — Telephone Encounter (Signed)
 Patient need a follow up in December or January scheduled to continue to get refills

## 2024-01-11 ENCOUNTER — Encounter: Payer: Self-pay | Admitting: Nurse Practitioner

## 2024-01-27 ENCOUNTER — Other Ambulatory Visit (HOSPITAL_COMMUNITY): Payer: Self-pay

## 2024-01-27 ENCOUNTER — Telehealth: Payer: Self-pay

## 2024-01-27 NOTE — Telephone Encounter (Signed)
 [name], [name] [place])   Pharmacy Patient Advocate Encounter   Received notification from Onbase that prior authorization for Wegovy  1 is required/requested.   Insurance verification completed.   The patient is insured through [facility] MEDICAID.   Per test claim: Effective October 1st, Medicaid discontinued coverage of GLP1 medications for weight loss (such as Wegovy  and Zepbound), unless the patient has a documented history of a heart attack or stroke. Zepbound will continue to be covered only for patients with moderate to severe sleep apnea (AHI 15-30) and a BMI greater than 40. Because of this change, the prior authorization team will not be submitting new PA requests for GLP1 medications prescribed for weight loss, as patients will be unable to continue therapy under Medicaid coverage.  No MASH, CVA, OSA in patient chart.

## 2024-02-02 ENCOUNTER — Other Ambulatory Visit (HOSPITAL_COMMUNITY): Payer: Self-pay

## 2024-02-06 ENCOUNTER — Other Ambulatory Visit: Payer: Self-pay | Admitting: Nurse Practitioner

## 2024-02-06 DIAGNOSIS — F411 Generalized anxiety disorder: Secondary | ICD-10-CM

## 2024-02-06 DIAGNOSIS — M6289 Other specified disorders of muscle: Secondary | ICD-10-CM

## 2024-02-29 ENCOUNTER — Ambulatory Visit: Admitting: Nurse Practitioner

## 2024-02-29 VITALS — BP 102/80 | HR 77 | Temp 98.2°F | Ht 64.0 in | Wt 205.2 lb

## 2024-02-29 DIAGNOSIS — R110 Nausea: Secondary | ICD-10-CM | POA: Diagnosis not present

## 2024-02-29 DIAGNOSIS — K219 Gastro-esophageal reflux disease without esophagitis: Secondary | ICD-10-CM | POA: Diagnosis not present

## 2024-02-29 DIAGNOSIS — Z7985 Long-term (current) use of injectable non-insulin antidiabetic drugs: Secondary | ICD-10-CM | POA: Diagnosis not present

## 2024-02-29 DIAGNOSIS — Z6835 Body mass index (BMI) 35.0-35.9, adult: Secondary | ICD-10-CM

## 2024-02-29 DIAGNOSIS — E119 Type 2 diabetes mellitus without complications: Secondary | ICD-10-CM | POA: Diagnosis not present

## 2024-02-29 LAB — COMPREHENSIVE METABOLIC PANEL WITH GFR
ALT: 24 U/L (ref 3–35)
AST: 19 U/L (ref 5–37)
Albumin: 4.3 g/dL (ref 3.5–5.2)
Alkaline Phosphatase: 57 U/L (ref 39–117)
BUN: 20 mg/dL (ref 6–23)
CO2: 29 meq/L (ref 19–32)
Calcium: 9.1 mg/dL (ref 8.4–10.5)
Chloride: 103 meq/L (ref 96–112)
Creatinine, Ser: 0.87 mg/dL (ref 0.40–1.20)
GFR: 77.31 mL/min
Glucose, Bld: 83 mg/dL (ref 70–99)
Potassium: 4 meq/L (ref 3.5–5.1)
Sodium: 137 meq/L (ref 135–145)
Total Bilirubin: 0.4 mg/dL (ref 0.2–1.2)
Total Protein: 7.5 g/dL (ref 6.0–8.3)

## 2024-02-29 LAB — CBC
HCT: 40 % (ref 36.0–46.0)
Hemoglobin: 13.3 g/dL (ref 12.0–15.0)
MCHC: 33.4 g/dL (ref 30.0–36.0)
MCV: 90.2 fl (ref 78.0–100.0)
Platelets: 290 K/uL (ref 150.0–400.0)
RBC: 4.43 Mil/uL (ref 3.87–5.11)
RDW: 12.6 % (ref 11.5–15.5)
WBC: 6.3 K/uL (ref 4.0–10.5)

## 2024-02-29 LAB — HEMOGLOBIN A1C: Hgb A1c MFr Bld: 6.4 % (ref 4.6–6.5)

## 2024-02-29 MED ORDER — OMEPRAZOLE 40 MG PO CPDR: 40.0000 mg | DELAYED_RELEASE_CAPSULE | Freq: Every day | ORAL | 0 refills | Status: AC

## 2024-02-29 MED ORDER — TRULICITY 0.75 MG/0.5ML ~~LOC~~ SOAJ: 0.7500 mg | SUBCUTANEOUS | 0 refills | Status: AC

## 2024-02-29 MED ORDER — ONDANSETRON 4 MG PO TBDP: 4.0000 mg | ORAL_TABLET | Freq: Three times a day (TID) | ORAL | 0 refills | Status: AC | PRN

## 2024-02-29 NOTE — Progress Notes (Addendum)
 "  Established Patient Office Visit  Subjective   Patient ID: [name], female    DOB: [birth year]  Age: 54 y.o. MRN: [id number]  Chief Complaint  Patient presents with   Follow-up    HPI  Discussed the use of AI scribe software for clinical note transcription with the patient, who gave verbal consent to proceed.  History of Present Illness [name] is a 54 year old female who presents with issues related to weight management and medication side effects.  She has been experiencing challenges with weight management. She was previously started on Wegovy  for weight loss and initially lost ten pounds. However, she experienced significant gastrointestinal side effects while on the medication and subsequently stopped taking it. Since stopping Wegovy  several months ago, she has noticed an increase in cravings for sugary foods, which she had previously managed to avoid. Her current eating pattern involves consuming two to three meals a day, with a tendency to overeat during one meal. She has been trying to manage her sweet cravings with lower-calorie alternatives like fudge bars and zero-sugar puddings.  Her fluid intake primarily consists of water, with a reduction in sugary tea consumption. She occasionally drinks zero-calorie beverages for variety. She is physically active, walking three to four times a day, though not over long distances.  She reports morning nausea, which she describes as manageable and not debilitating. She uses Zofran  for relief when necessary and takes omeprazole  daily, with occasional use of famotidine  if needed. Her bowel movements are regular, occurring once a day. She monitors her blood glucose levels weekly, with readings ranging from 90 to 127 mg/dL. Her A1c is currently 6.6%.  There is a family history of gallbladder issues, as she mentions that everyone in her family has had their gallbladder removed.     Review of Systems  Constitutional:  Negative  for chills and fever.  Respiratory:  Negative for shortness of breath.   Cardiovascular:  Negative for chest pain.  Gastrointestinal:  Positive for nausea. Negative for abdominal pain, constipation and vomiting.  Neurological:  Negative for dizziness and headaches.      Objective:     BP 102/80   Pulse 77   Temp 98.2 F (36.8 C) (Oral)   Ht 5' 4 (1.626 m)   Wt 205 lb 3.2 oz (93.1 kg)   SpO2 98%   BMI 35.22 kg/m  BP Readings from Last 3 Encounters:  02/29/24 102/80  10/02/23 137/83  10/01/23 (!) 138/95   Wt Readings from Last 3 Encounters:  02/29/24 205 lb 3.2 oz (93.1 kg)  12/07/23 209 lb (94.8 kg)  10/02/23 208 lb 15.9 oz (94.8 kg)   SpO2 Readings from Last 3 Encounters:  02/29/24 98%  10/02/23 98%  10/01/23 100%      Physical Exam Vitals and nursing note reviewed.  Constitutional:      Appearance: Normal appearance.  Cardiovascular:     Rate and Rhythm: Normal rate and regular rhythm.     Heart sounds: Normal heart sounds.  Pulmonary:     Effort: Pulmonary effort is normal.     Breath sounds: Normal breath sounds.  Abdominal:     General: Bowel sounds are normal. There is no distension.     Palpations: There is no mass.     Tenderness: There is no abdominal tenderness.     Hernia: No hernia is present.  Neurological:     Mental Status: She is alert.      No results  found for any visits on 02/29/24.    The 10-year ASCVD risk score (Arnett DK, et al., 2019) is: 2.4%    Assessment & Plan:   Problem List Items Addressed This Visit       Digestive   Gastroesophageal reflux disease   Relevant Medications   omeprazole  (PRILOSEC) 40 MG capsule   ondansetron  (ZOFRAN -ODT) 4 MG disintegrating tablet     Endocrine   Controlled type 2 diabetes mellitus without complication, without long-term current use of insulin (HCC)   Relevant Medications   Dulaglutide  (TRULICITY ) 0.75 MG/0.5ML SOAJ   Other Relevant Orders   CBC   Comprehensive metabolic  panel with GFR   Hemoglobin A1c   Other Visit Diagnoses       Nausea    -  Primary   Relevant Medications   omeprazole  (PRILOSEC) 40 MG capsule   ondansetron  (ZOFRAN -ODT) 4 MG disintegrating tablet     Morbid obesity (HCC)       Relevant Medications   Dulaglutide  (TRULICITY ) 0.75 MG/0.5ML SOAJ      Assessment and Plan Assessment & Plan Obesity Weight loss with Wegovy  was halted due to GI side effects. Discussed alternative medications Trulicity  and Mounjaro, considering insurance and BMI criteria. Potential for nausea with GLP-1 receptor agonists noted. - Checked labs for normalization post-hospitalization. - Rechecked A1c. - Submitted request for Trulicity  and checked insurance coverage. - Consider Mounjaro if Trulicity  is not covered or tolerated. - Continue current dietary and exercise regimen.  Diabetes type 2 A1c at 6.6 indicates diabetes. Discussed Trulicity  for weight and glycemic control, contingent on insurance coverage. Potential for nausea with GLP-1 receptor agonists noted. - Rechecked A1c. - Submitted request for Trulicity  and checked insurance coverage.  Gastroesophageal reflux disease Morning nausea possibly due to GERD. Current management includes omeprazole  and Pepcid . Discussed increasing omeprazole  for better control. - Increased omeprazole  to 40 mg daily. - Continue Pepcid  as needed for breakthrough symptoms. - Ondansetron  4 mg ODT 3 times daily as needed.  General Health Maintenance Discussed dietary habits and physical activity. Emphasized reducing sugary foods and beverages. - Continue current dietary modifications, focusing on reducing sugary foods and beverages. - Encouraged continued physical activity, such as walking.  Return in about 3 months (around 05/29/2024) for DM recheck/weight.    Adina Crandall, NP  "

## 2024-03-01 ENCOUNTER — Telehealth: Payer: Self-pay

## 2024-03-01 ENCOUNTER — Other Ambulatory Visit (HOSPITAL_COMMUNITY): Payer: Self-pay

## 2024-03-01 NOTE — Telephone Encounter (Signed)
 Pharmacy Patient Advocate Encounter  Received notification from [employer] that Prior Authorization for Trulicity  0.75 has been APPROVED from 03/01/24 to 03/01/25. Ran test claim, Copay is $4.00. This test claim was processed through [facility]- copay amounts may vary at other pharmacies due to pharmacy/plan contracts, or as the patient moves through the different stages of their insurance plan.   PA #/Case ID/Reference #: # [id number]

## 2024-03-01 NOTE — Telephone Encounter (Signed)
 Pharmacy Patient Advocate Encounter   Received notification from [facility] that prior authorization for Trulicity  0.75 is required/requested.   Insurance verification completed.   The patient is insured through [employer].   Per test claim: PA required; PA submitted to above mentioned insurance via Latent Key/confirmation #/EOC [id number] Status is pending

## 2024-03-02 ENCOUNTER — Ambulatory Visit: Payer: Self-pay | Admitting: Nurse Practitioner

## 2024-03-07 ENCOUNTER — Ambulatory Visit: Admitting: Podiatry

## 2024-03-07 ENCOUNTER — Other Ambulatory Visit: Payer: Self-pay | Admitting: Nurse Practitioner

## 2024-03-07 VITALS — Ht 64.0 in | Wt 205.0 lb

## 2024-03-07 DIAGNOSIS — F411 Generalized anxiety disorder: Secondary | ICD-10-CM

## 2024-03-07 DIAGNOSIS — M1380 Other specified arthritis, unspecified site: Secondary | ICD-10-CM | POA: Diagnosis not present

## 2024-03-07 MED ORDER — CELECOXIB 200 MG PO CAPS: 200.0000 mg | ORAL_CAPSULE | Freq: Two times a day (BID) | ORAL | 1 refills | Status: AC

## 2024-03-07 NOTE — Progress Notes (Signed)
 "  Subjective:  Patient ID: [name], female    DOB: 12-29-1970,  MRN: [id number]  Chief Complaint  Patient presents with   Arthritis    RM 1 Patient is here to f/u bilateral arthritis, peroneal tendonitis. Pt states chronic throbbing pain in left foot and pain when attempting to flex the right foot.    Discussed the use of AI scribe software for clinical note transcription with the patient, who gave verbal consent to proceed.  History of Present Illness [name] is a 54 year old female who presents with bilateral foot and ankle pain.  She experiences bilateral foot and ankle pain, with the right foot pain being manageable and intermittent. Over-the-counter pain relief cream similar to Voltaren provides some relief for the right foot. 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 The pain sometimes disrupts her sleep, occurring one or two nights a week, and tends to build up over the week, feeling better in the morning but worsening by midweek.  An MRI conducted on September 08, 2023, revealed significant tenosynovitis and tendinosis of the peroneal tendons and the flexor hallucis longus (FHL) in the posterior left ankle joint. The right ankle did not show any tenosynovitis or tendinosis. She tends to favor the left ankle, wearing down sandals unevenly, and describes a popping sensation, especially on uneven surfaces. The pain is more tender at the top of the ankle.  Current medications include the CVS version of Voltaren gel, which provides some relief, but ibuprofen, Tylenol , and naproxen do not alleviate the pain. She uses an ankle brace on the left ankle, which helps when used proactively in the afternoons.   Interval history: She returns today for follow-up has not had much improvement in her symptoms, has not had physical therapy, no one called to schedule it.        Objective:    Physical Exam VASCULAR: DP and PT pulse palpable. Foot is warm and well-perfused. Capillary fill time is brisk. DERMATOLOGIC: Normal skin turgor, texture, and temperature. No open lesions, rashes, or ulcerations. NEUROLOGIC: Normal sensation to light touch and pressure. No paresthesias. ORTHOPEDIC: Today most the pain centered around the right subtalar joint and dorsal left midfoot   No images are attached to the encounter.    Results RADIOLOGY Bilateral ankle MRI: Significant tenosynovitis and tendinosis of the peroneal tendons and flexor hallucis longus (FHL) in the left posterior ankle joint; no tenosynovitis or tendinosis in the right ankle (09/08/2023)   Assessment:   1. Inflammatory arthritis      Plan:  Patient was evaluated and treated and all questions answered.  Assessment and Plan Assessment & Plan Right ankle subtalar joint pain and left midfoot arthritis She returns for follow-up today I do think physical therapy is still beneficial for her and I gave her the number for scheduling for this the referral appears to still be open.  She will let me know if we need to renew it.  We discussed medical therapy with Celebrex  and risks and benefits of the medication.  Rx sent to pharmacy.  I also recommended checking for inflammatory forms of arthritis she has a family history of rheumatoid arthritis and ESR CRP rheumatoid factor uric acid HLA-B27 CCP and ANA were ordered.  CBC and BMP were unrevealing last week.  Follow-up with me in 2 months to reevaluate or sooner.      Return in about 2  months (around 05/05/2024).   "

## 2024-03-07 NOTE — Patient Instructions (Signed)
 Call to schedule PT:  [phone]

## 2024-03-08 ENCOUNTER — Encounter: Payer: Self-pay | Admitting: Nurse Practitioner

## 2024-03-08 ENCOUNTER — Other Ambulatory Visit: Payer: Self-pay | Admitting: Nurse Practitioner

## 2024-03-08 ENCOUNTER — Encounter: Payer: Self-pay | Admitting: Podiatry

## 2024-03-08 DIAGNOSIS — M6289 Other specified disorders of muscle: Secondary | ICD-10-CM

## 2024-03-08 DIAGNOSIS — F411 Generalized anxiety disorder: Secondary | ICD-10-CM

## 2024-03-12 ENCOUNTER — Encounter: Payer: Self-pay | Admitting: Nurse Practitioner

## 2024-03-18 LAB — URIC ACID: Uric Acid: 4 mg/dL (ref 3.0–7.2)

## 2024-03-18 LAB — ANA,IFA RA DIAG PNL W/RFLX TIT/PATN
ANA Titer 1: NEGATIVE
Cyclic Citrullin Peptide Ab: 8 U (ref 0–19)
Rheumatoid fact SerPl-aCnc: <10 [IU]/mL

## 2024-03-18 LAB — C-REACTIVE PROTEIN: CRP: 2 mg/L (ref 0–10)

## 2024-03-18 LAB — SEDIMENTATION RATE: Sed Rate: 7 mm/h (ref 0–40)

## 2024-03-18 LAB — HLA-B27 ANTIGEN: HLA B27: NEGATIVE

## 2024-03-18 LAB — ANTI-CCP AB, IGG + IGA (RDL): Anti-CCP Ab, IgG + IgA (RDL): <20 U

## 2024-03-20 ENCOUNTER — Ambulatory Visit: Payer: Self-pay | Admitting: Podiatry

## 2024-03-27 ENCOUNTER — Other Ambulatory Visit (HOSPITAL_BASED_OUTPATIENT_CLINIC_OR_DEPARTMENT_OTHER)

## 2024-03-27 ENCOUNTER — Other Ambulatory Visit: Payer: Self-pay | Admitting: Nurse Practitioner

## 2024-03-27 DIAGNOSIS — K219 Gastro-esophageal reflux disease without esophagitis: Secondary | ICD-10-CM

## 2024-03-29 ENCOUNTER — Other Ambulatory Visit: Payer: Self-pay | Admitting: Nurse Practitioner

## 2024-03-29 DIAGNOSIS — F411 Generalized anxiety disorder: Secondary | ICD-10-CM

## 2024-05-07 ENCOUNTER — Ambulatory Visit: Admitting: Podiatry

## 2024-05-29 ENCOUNTER — Ambulatory Visit: Admitting: Nurse Practitioner
